# Patient Record
Sex: Female | Born: 1940 | Race: White | Hispanic: No | Marital: Married | Smoking: Never smoker
Health system: Southern US, Community
[De-identification: ages and names within clinical notes are randomized; demographics above are authoritative.]

## PROBLEM LIST (undated history)

## (undated) DIAGNOSIS — E7800 Pure hypercholesterolemia, unspecified: Secondary | ICD-10-CM

## (undated) DIAGNOSIS — K589 Irritable bowel syndrome without diarrhea: Secondary | ICD-10-CM

## (undated) DIAGNOSIS — I10 Essential (primary) hypertension: Secondary | ICD-10-CM

## (undated) DIAGNOSIS — R1900 Intra-abdominal and pelvic swelling, mass and lump, unspecified site: Secondary | ICD-10-CM

## (undated) DIAGNOSIS — G4700 Insomnia, unspecified: Secondary | ICD-10-CM

## (undated) DIAGNOSIS — E039 Hypothyroidism, unspecified: Secondary | ICD-10-CM

## (undated) DIAGNOSIS — F419 Anxiety disorder, unspecified: Secondary | ICD-10-CM

## (undated) HISTORY — DX: Anxiety disorder, unspecified: F41.9

## (undated) HISTORY — PX: ABDOMINAL HYSTERECTOMY: SHX81

## (undated) HISTORY — DX: Hypothyroidism, unspecified: E03.9

## (undated) HISTORY — DX: Irritable bowel syndrome, unspecified: K58.9

## (undated) HISTORY — DX: Intra-abdominal and pelvic swelling, mass and lump, unspecified site: R19.00

## (undated) HISTORY — PX: BACK SURGERY: SHX140

## (undated) HISTORY — DX: Insomnia, unspecified: G47.00

## (undated) HISTORY — PX: CATARACT EXTRACTION: SUR2

---

## 2011-08-25 DIAGNOSIS — E7800 Pure hypercholesterolemia, unspecified: Secondary | ICD-10-CM | POA: Insufficient documentation

## 2013-09-02 DIAGNOSIS — M461 Sacroiliitis, not elsewhere classified: Secondary | ICD-10-CM | POA: Insufficient documentation

## 2015-01-25 DIAGNOSIS — I10 Essential (primary) hypertension: Secondary | ICD-10-CM | POA: Insufficient documentation

## 2015-11-26 DIAGNOSIS — M4716 Other spondylosis with myelopathy, lumbar region: Secondary | ICD-10-CM | POA: Insufficient documentation

## 2016-09-02 DIAGNOSIS — E039 Hypothyroidism, unspecified: Secondary | ICD-10-CM | POA: Insufficient documentation

## 2017-02-24 ENCOUNTER — Other Ambulatory Visit: Payer: Self-pay | Admitting: Nurse Practitioner

## 2017-02-24 DIAGNOSIS — M4716 Other spondylosis with myelopathy, lumbar region: Secondary | ICD-10-CM

## 2017-03-09 ENCOUNTER — Other Ambulatory Visit: Payer: Self-pay

## 2017-03-11 ENCOUNTER — Ambulatory Visit
Admission: RE | Admit: 2017-03-11 | Discharge: 2017-03-11 | Disposition: A | Discharge status: Home / Self Care | Payer: Medicare Other | Source: Ambulatory Visit | Attending: Nurse Practitioner | Admitting: Nurse Practitioner

## 2017-03-11 DIAGNOSIS — M4716 Other spondylosis with myelopathy, lumbar region: Secondary | ICD-10-CM

## 2017-03-11 MED ORDER — GADOBENATE DIMEGLUMINE 529 MG/ML IV SOLN
13.0000 mL | Freq: Once | INTRAVENOUS | Status: AC | PRN
  Administered 2017-03-11: 13 mL via INTRAVENOUS

## 2017-03-25 ENCOUNTER — Other Ambulatory Visit: Payer: Self-pay | Admitting: Neurosurgery

## 2017-03-25 DIAGNOSIS — M5126 Other intervertebral disc displacement, lumbar region: Secondary | ICD-10-CM

## 2017-03-26 ENCOUNTER — Other Ambulatory Visit: Payer: Self-pay | Admitting: Neurosurgery

## 2017-03-26 ENCOUNTER — Ambulatory Visit
Admission: RE | Admit: 2017-03-26 | Discharge: 2017-03-26 | Disposition: A | Discharge status: Home / Self Care | Payer: Medicare Other | Source: Ambulatory Visit | Attending: Neurosurgery | Admitting: Neurosurgery

## 2017-03-26 DIAGNOSIS — M1990 Unspecified osteoarthritis, unspecified site: Secondary | ICD-10-CM | POA: Insufficient documentation

## 2017-03-26 DIAGNOSIS — M5126 Other intervertebral disc displacement, lumbar region: Secondary | ICD-10-CM

## 2017-03-26 DIAGNOSIS — K589 Irritable bowel syndrome without diarrhea: Secondary | ICD-10-CM | POA: Insufficient documentation

## 2017-03-26 DIAGNOSIS — F419 Anxiety disorder, unspecified: Secondary | ICD-10-CM | POA: Insufficient documentation

## 2017-03-26 MED ORDER — IOPAMIDOL (ISOVUE-M 200) INJECTION 41%
1.0000 mL | Freq: Once | INTRAMUSCULAR | Status: AC
  Administered 2017-03-26: 1 mL via EPIDURAL

## 2017-03-26 MED ORDER — METHYLPREDNISOLONE ACETATE 40 MG/ML INJ SUSP (RADIOLOG
120.0000 mg | Freq: Once | INTRAMUSCULAR | Status: AC
  Administered 2017-03-26: 120 mg via EPIDURAL

## 2017-03-26 NOTE — Discharge Instructions (Signed)

## 2017-05-22 ENCOUNTER — Other Ambulatory Visit: Payer: Self-pay | Admitting: Nurse Practitioner

## 2017-05-22 DIAGNOSIS — M4716 Other spondylosis with myelopathy, lumbar region: Secondary | ICD-10-CM

## 2017-06-03 ENCOUNTER — Ambulatory Visit
Admission: RE | Admit: 2017-06-03 | Discharge: 2017-06-03 | Disposition: A | Discharge status: Home / Self Care | Payer: Medicare Other | Source: Ambulatory Visit | Attending: Nurse Practitioner | Admitting: Nurse Practitioner

## 2017-06-03 DIAGNOSIS — M4716 Other spondylosis with myelopathy, lumbar region: Secondary | ICD-10-CM

## 2017-06-03 MED ORDER — IOPAMIDOL (ISOVUE-M 200) INJECTION 41%
1.0000 mL | Freq: Once | INTRAMUSCULAR | Status: AC
  Administered 2017-06-03: 1 mL via EPIDURAL

## 2017-06-03 MED ORDER — METHYLPREDNISOLONE ACETATE 40 MG/ML INJ SUSP (RADIOLOG
120.0000 mg | Freq: Once | INTRAMUSCULAR | Status: AC
  Administered 2017-06-03: 120 mg via EPIDURAL

## 2017-06-03 NOTE — Discharge Instructions (Signed)

## 2018-04-12 ENCOUNTER — Emergency Department (HOSPITAL_COMMUNITY): Payer: Medicare Other

## 2018-04-12 ENCOUNTER — Encounter (HOSPITAL_COMMUNITY): Payer: Self-pay | Admitting: Emergency Medicine

## 2018-04-12 ENCOUNTER — Other Ambulatory Visit: Payer: Self-pay

## 2018-04-12 ENCOUNTER — Emergency Department (HOSPITAL_COMMUNITY)
Admission: EM | Admit: 2018-04-12 | Discharge: 2018-04-12 | Disposition: A | Discharge status: Home / Self Care | Payer: Medicare Other | Attending: Emergency Medicine | Admitting: Emergency Medicine

## 2018-04-12 DIAGNOSIS — Z79899 Other long term (current) drug therapy: Secondary | ICD-10-CM | POA: Insufficient documentation

## 2018-04-12 DIAGNOSIS — R51 Headache: Secondary | ICD-10-CM | POA: Diagnosis present

## 2018-04-12 DIAGNOSIS — G4489 Other headache syndrome: Secondary | ICD-10-CM | POA: Diagnosis not present

## 2018-04-12 DIAGNOSIS — J0110 Acute frontal sinusitis, unspecified: Secondary | ICD-10-CM | POA: Insufficient documentation

## 2018-04-12 DIAGNOSIS — I10 Essential (primary) hypertension: Secondary | ICD-10-CM | POA: Insufficient documentation

## 2018-04-12 DIAGNOSIS — Z7982 Long term (current) use of aspirin: Secondary | ICD-10-CM | POA: Diagnosis not present

## 2018-04-12 HISTORY — DX: Essential (primary) hypertension: I10

## 2018-04-12 HISTORY — DX: Pure hypercholesterolemia, unspecified: E78.00

## 2018-04-12 LAB — CBC WITH DIFFERENTIAL/PLATELET
BASOS ABS: 0 10*3/uL (ref 0.0–0.1)
BASOS PCT: 0 %
EOS PCT: 1 %
Eosinophils Absolute: 0.2 10*3/uL (ref 0.0–0.7)
HEMATOCRIT: 40.6 % (ref 36.0–46.0)
Hemoglobin: 13.3 g/dL (ref 12.0–15.0)
LYMPHS PCT: 15 %
Lymphs Abs: 2.9 10*3/uL (ref 0.7–4.0)
MCH: 31.8 pg (ref 26.0–34.0)
MCHC: 32.8 g/dL (ref 30.0–36.0)
MCV: 97.1 fL (ref 78.0–100.0)
Monocytes Absolute: 1.5 10*3/uL — ABNORMAL HIGH (ref 0.1–1.0)
Monocytes Relative: 8 %
NEUTROS ABS: 15.2 10*3/uL — AB (ref 1.7–7.7)
Neutrophils Relative %: 76 %
PLATELETS: 325 10*3/uL (ref 150–400)
RBC: 4.18 MIL/uL (ref 3.87–5.11)
RDW: 14.5 % (ref 11.5–15.5)
WBC: 19.9 10*3/uL — AB (ref 4.0–10.5)

## 2018-04-12 LAB — COMPREHENSIVE METABOLIC PANEL
ALK PHOS: 98 U/L (ref 38–126)
ALT: 21 U/L (ref 0–44)
ANION GAP: 12 (ref 5–15)
AST: 16 U/L (ref 15–41)
Albumin: 4 g/dL (ref 3.5–5.0)
BILIRUBIN TOTAL: 0.5 mg/dL (ref 0.3–1.2)
BUN: 21 mg/dL (ref 8–23)
CALCIUM: 9.4 mg/dL (ref 8.9–10.3)
CO2: 24 mmol/L (ref 22–32)
Chloride: 105 mmol/L (ref 98–111)
Creatinine, Ser: 1.15 mg/dL — ABNORMAL HIGH (ref 0.44–1.00)
GFR calc Af Amer: 52 mL/min — ABNORMAL LOW (ref >60)
GFR, EST NON AFRICAN AMERICAN: 45 mL/min — AB (ref >60)
Glucose, Bld: 116 mg/dL — ABNORMAL HIGH (ref 70–99)
POTASSIUM: 3.6 mmol/L (ref 3.5–5.1)
Sodium: 141 mmol/L (ref 135–145)
TOTAL PROTEIN: 7.1 g/dL (ref 6.5–8.1)

## 2018-04-12 LAB — SEDIMENTATION RATE: Sed Rate: 23 mm/hr — ABNORMAL HIGH (ref 0–22)

## 2018-04-12 MED ORDER — SODIUM CHLORIDE 0.9 % IV BOLUS
1000.0000 mL | Freq: Once | INTRAVENOUS | Status: AC
  Administered 2018-04-12: 1000 mL via INTRAVENOUS

## 2018-04-12 MED ORDER — CLINDAMYCIN HCL 150 MG PO CAPS: 300.0000 mg | ORAL_CAPSULE | Freq: Three times a day (TID) | ORAL | 0 refills | Status: AC

## 2018-04-12 MED ORDER — FENTANYL CITRATE (PF) 100 MCG/2ML IJ SOLN
25.0000 ug | Freq: Once | INTRAMUSCULAR | Status: AC
  Administered 2018-04-12: 25 ug via INTRAVENOUS
  Filled 2018-04-12: qty 2

## 2018-04-12 MED ORDER — FLUTICASONE PROPIONATE 50 MCG/ACT NA SUSP: NASAL | 0 refills | Status: DC

## 2018-04-12 MED ORDER — IOHEXOL 300 MG/ML  SOLN
60.0000 mL | Freq: Once | INTRAMUSCULAR | Status: AC | PRN
  Administered 2018-04-12: 60 mL via INTRAVENOUS

## 2018-04-12 MED ORDER — CLINDAMYCIN HCL 150 MG PO CAPS
300.0000 mg | ORAL_CAPSULE | Freq: Once | ORAL | Status: AC
  Administered 2018-04-12: 300 mg via ORAL
  Filled 2018-04-12: qty 2

## 2018-04-12 MED ORDER — DEXAMETHASONE SODIUM PHOSPHATE 10 MG/ML IJ SOLN
10.0000 mg | Freq: Once | INTRAMUSCULAR | Status: AC
  Administered 2018-04-12: 10 mg via INTRAVENOUS
  Filled 2018-04-12: qty 1

## 2018-04-12 MED ORDER — PROCHLORPERAZINE EDISYLATE 10 MG/2ML IJ SOLN
5.0000 mg | Freq: Once | INTRAMUSCULAR | Status: AC
  Administered 2018-04-12: 5 mg via INTRAVENOUS
  Filled 2018-04-12: qty 2

## 2018-04-12 NOTE — ED Provider Notes (Signed)
[facility] EMERGENCY DEPARTMENT Provider Note   CSN: [id number] Arrival date & time: 04/12/18  1401     History   Chief Complaint Chief Complaint  Patient presents with  . Headache    HPI Daira Hine is a 77 y.o. female.  HPI Patient presents with her husband who assists with the HPI. She presents due to headache, which is atypical for her. She does have a history of headaches associate with sinus infection, but has no ongoing sinus infection-like symptoms. Yesterday she developed this headache, which spontaneously improved for several hours, but recurred today about 6 hours ago, and since that time has been persistent, worsening, is severe across the frontal head. No vision changes, there is some nausea, no vomiting, no confusion. No relief with OTC medication.  Past Medical History:  Diagnosis Date  . High cholesterol   . Hypertension     Patient Active Problem List   Diagnosis Date Noted  . Anxiety 03/26/2017  . IBS (irritable bowel syndrome) 03/26/2017  . Osteoarthritis 03/26/2017  . Acquired hypothyroidism 09/02/2016  . 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 Essential hypertension 01/25/2015  . Sacroiliitis, not elsewhere classified (HCC) 09/02/2013  . Pure hypercholesterolemia 08/25/2011    Past Surgical History:  Procedure Laterality Date  . ABDOMINAL HYSTERECTOMY    . BACK SURGERY    . CATARACT EXTRACTION       OB History    Gravida      Para      Term      Preterm      AB      Living  1     SAB      TAB      Ectopic      Multiple      Live Births               Home Medications    Prior to Admission medications   Medication Sig Start Date End Date Taking? Authorizing Provider  ALPRAZolam Prudy Feeler) 0.5 MG tablet Take 0.5 mg by mouth 2 (two) times daily as needed for anxiety or sleep.  11/10/16  Yes [provider]  amLODipine (NORVASC) 10 MG tablet TAKE 1 TABLET BY MOUTH  DAILY 02/13/17  Yes  [provider]  aspirin EC 325 MG tablet Take 325 mg by mouth daily.    Yes [provider]  diclofenac (VOLTAREN) 75 MG EC tablet TAKE ONE TABLET BY MOUTH 2  TIMES DAILY. 11/10/16  Yes [provider]  dicyclomine (BENTYL) 20 MG tablet Take 20 mg by mouth daily as needed for spasms.  04/02/16  Yes [provider]  fluticasone (FLONASE) 50 MCG/ACT nasal spray USE TWO SPRAY(S) IN EACH NOSTRIL ONCE DAILY 04/14/16  Yes [provider]  irbesartan (AVAPRO) 300 MG tablet Take 300 mg by mouth daily.  03/09/18  Yes [provider]  levothyroxine (SYNTHROID, LEVOTHROID) 25 MCG tablet Take 25 mcg by mouth daily before breakfast.  07/24/16  Yes [provider]  oxyCODONE-acetaminophen (PERCOCET/ROXICET) 5-325 MG tablet Take 1 tablet by mouth every 6 (six) hours as needed for moderate pain or severe pain.  03/14/16  Yes [provider]  simvastatin (ZOCOR) 20 MG tablet TAKE 1 TABLET BY MOUTH AT  BEDTIME 12/12/16  Yes [provider]  traMADol (ULTRAM) 50 MG tablet Take 50 mg by mouth every 6 (six) hours as needed for moderate pain.  02/16/17  Yes [provider]    Family History History  reviewed. No pertinent family history.  Social History Social History   Tobacco Use  . Smoking status: Never Smoker  . Smokeless tobacco: Never Used  Substance Use Topics  . Alcohol use: Yes    Frequency: Never    Comment: occ  . Drug use: Never     Allergies   Acetaminophen; Penicillins; Sulfa antibiotics; Gabapentin; Niacin and related; and Pitavastatin   Review of Systems Review of Systems  Constitutional:       Per HPI, otherwise negative  HENT:       Per HPI, otherwise negative  Respiratory:       Per HPI, otherwise negative  Cardiovascular:       Per HPI, otherwise negative  Gastrointestinal: Positive for nausea. Negative for vomiting.  Endocrine:       Negative aside from HPI  Genitourinary:       Neg aside from  HPI   Musculoskeletal:       Per HPI, otherwise negative  Skin: Negative.   Neurological: Positive for light-headedness and headaches. Negative for syncope.     Physical Exam Updated Vital Signs BP (!) 161/77 (BP Location: Left Arm)   Pulse 95   Temp 97.9 F (36.6 C) (Oral)   Resp 15   Ht 5' (1.524 m)   Wt 65.3 kg (144 lb)   SpO2 98%   BMI 28.12 kg/m   Physical Exam  Constitutional: She is oriented to person, place, and time. She appears well-developed and well-nourished. No distress.  Uncomfortable appearing elderly female  HENT:  Head: Normocephalic and atraumatic.  Tenderness to palpation throughout the forehead and bilateral temporal areas  Eyes: Conjunctivae and EOM are normal.  Cardiovascular: Normal rate and regular rhythm.  Pulmonary/Chest: Effort normal and breath sounds normal. No stridor. No respiratory distress.  Abdominal: She exhibits no distension.  Musculoskeletal: She exhibits no edema.  Neurological: She is alert and oriented to person, place, and time. She displays atrophy. She displays no tremor. No cranial nerve deficit. She exhibits normal muscle tone. She displays no seizure activity. Coordination normal.  Skin: Skin is warm and dry.  Psychiatric: She has a normal mood and affect.  Nursing note and vitals reviewed.    ED Treatments / Results  Labs (all labs ordered are listed, but only abnormal results are displayed) Labs Reviewed  COMPREHENSIVE METABOLIC PANEL - Abnormal; Notable for the following components:      Result Value   Glucose, Bld 116 (*)    Creatinine, Ser 1.15 (*)    GFR calc non Af Amer 45 (*)    GFR calc Af Amer 52 (*)    All other components within normal limits  CBC WITH DIFFERENTIAL/PLATELET - Abnormal; Notable for the following components:   WBC 19.9 (*)    Neutro Abs 15.2 (*)    Monocytes Absolute 1.5 (*)    All other components within normal limits  SEDIMENTATION RATE - Abnormal; Notable for the following components:     Sed Rate 23 (*)    All other components within normal limits    EKG None  Radiology Ct Venogram Head  Result Date: 04/12/2018 CLINICAL DATA:  Headaches.  Dural venous sinus thrombosis suspected. EXAM: CT VENOGRAM HEAD TECHNIQUE: After administration of intravenous contrast in the venous phase axial CT was acquired of the head and surrounding structures with multiplanar reconstruction. CONTRAST:  60mL OMNIPAQUE IOHEXOL 300 MG/ML  SOLN COMPARISON:  None. FINDINGS: Brain: No stroke, hemorrhage, focal mass effect, extra-axial collection, or herniation. No  mild chronic microvascular ischemic changes and parenchymal volume loss of the brain. Vascular: Patent superior sagittal sinus, straight sinus, bilateral transverse sinus, bilateral sigmoid sinus, bilateral upper internal jugular veins. The left transverse sinus system is smaller than the right transverse sinus system, normal variant. Skull: Negative. Paranasal sinuses: Partial opacification of right mastoid tip. Normal aeration of left mastoid air cells. Opacification of the right frontal sinus, right anterior ethmoid air cells, and the partially visualized right maxillary sinus. Orbits: Bilateral intra-ocular lens replacement. Soft tissues: Right frontal region scalp nodule with calcifications measuring up to 12 mm. IMPRESSION: 1. No dural venous sinus thrombosis identified. 2. No acute intracranial abnormality. 3. Mild chronic microvascular ischemic changes and parenchymal volume loss of the brain. 4. Right frontal, anterior ethmoid, and maxillary sinus opacification. This is a right middle meatus obstructive pattern, direct visualization recommended. 5. Right superior frontal region scalp nodule measuring up to 12 mm, direct visualization recommended. Electronically Signed   By: Mitzi Hansen M.D.   On: 04/12/2018 17:13    Procedures Procedures (including critical care time)  Medications Ordered in ED Medications  clindamycin  (CLEOCIN) capsule 300 mg (has no administration in time range)  sodium chloride 0.9 % bolus 1,000 mL (0 mLs Intravenous Stopped 04/12/18 1722)  prochlorperazine (COMPAZINE) injection 5 mg (5 mg Intravenous Given 04/12/18 1501)  fentaNYL (SUBLIMAZE) injection 25 mcg (25 mcg Intravenous Given 04/12/18 1601)  dexamethasone (DECADRON) injection 10 mg (10 mg Intravenous Given 04/12/18 1600)  iohexol (OMNIPAQUE) 300 MG/ML solution 60 mL (60 mLs Intravenous Contrast Given 04/12/18 1648)     Initial Impression / Assessment and Plan / ED Course  I have reviewed the triage vital signs and the nursing notes.  Pertinent labs & imaging results that were available during my care of the patient were reviewed by me and considered in my medical decision making (see chart for details).     5:45 PM On repeat exam patient is feeling better. She has received Compazine, fluids, fentanyl, Decadron. After initial presentation concerning for substantial frontal head pain atypical headache, with differential including dural vein thrombosis, sinusitis, mass, the patient has had evaluation including labs notable for leukocytosis, and CT venogram consistent with acute sinusitis, with occlusion of medial meatus. Patient improved substantially here, and given this, absence of sustained fever, confusion, disorientation, and with reassuring CT scan, the patient will start antibiotics, inhaled steroids for relief. We also discussed option of Sudafed for additional therapy, and the patient notes that she has used this medicine previously, though some time ago for relief.  We discussed risks and benefits of this, and patient is amenable to trying it, also understanding of need to stop if she develops any untoward effects. After initial antibiotics provided here, and with the after mentioned improvement, she was discharged in stable condition.  Final Clinical Impressions(s) / ED Diagnoses  Cephalgia Acute sinusitis   Gerhard Munch,  MD 04/12/18 1747

## 2018-04-12 NOTE — ED Triage Notes (Signed)
PT was sent to ED for eval from urgent care today. PT states recent increased in her blood pressure medications x2 weeks and some relief of her headaches but the pain started back again yesterday.

## 2018-04-12 NOTE — ED Notes (Signed)
Pt with complaints of frontal HA since 10 am Pt very tearful. No deficits noted

## 2018-04-12 NOTE — Discharge Instructions (Signed)
In addition to the prescribed antibiotics, and inhaled steroid, please use Sudafed for additional relief, but as discussed, please stop this medication if you start to develop any untoward effects.  Return here for concerning changes in your condition.

## 2019-07-18 ENCOUNTER — Encounter: Payer: Self-pay | Admitting: Internal Medicine

## 2019-07-29 ENCOUNTER — Ambulatory Visit: Payer: Medicare Other | Admitting: Gastroenterology

## 2019-08-04 ENCOUNTER — Encounter: Payer: Self-pay | Admitting: Gastroenterology

## 2019-08-04 NOTE — Progress Notes (Signed)
Referring Provider: Hermine Messick, MD Primary Care Physician:  Hermine Messick, MD Primary Gastroenterologist:  Dr. Gala Romney  Chief Complaint  Patient presents with   Diarrhea    only happens when she goes several days w/o BM and then she will have several loose stools    HPI:   Jenny Garcia is a 78 y.o. female presenting today at the request of Hermine Messick, MD for diarrhea.  Patient called her PCP on 07/06/2019 with complaint of diarrhea and stomach cramps for 1 week.  Stated she took her medication for IBS which did not help her symptoms.  She was offered an office visit with her PCP but she declined and requested a referral to GI.  CBC on 05/31/2019 with hemoglobin 12.6. Elevated CA-125 at 57.7, overall stable since 2019. BMP on 07/29/2019 with creatinine 0.93 with GFR 59, otherwise normal.  TSH elevated at 5.37.   CT abdomen and pelvis with reduced contrast on 07/21/2019 for evaluation of pelvic mass and elevated CA-125 with cyst in the posterior right pelvis, possibly ovarian however the uterus is absent and ovaries are not identified.  Of note patient is difficult historian.  Patient reports having diarrhea for the last 2-3 months.  She will go 1 week without a bowel movement, then have 1 day where she will have a very large watery bowel movement or a few loose to watery stools.  She then returns to no bowel movement for another week.  Reports waking up in the mornings with her stomach hurting.  This is on the days that she has diarrhea.  Has been taking Bentyl for her stomach cramping.  Sometimes twice in 1 day.  Often her diarrhea is in the evenings to early morning hours.  Reporting timeframe between 8 PM and 1 AM.  May be 4-5 BMs during this time.  Also reports stomach feeling up with gas.  She and her husband eat a light snack before bed.  Reports being diagnosed with IBS in the 1970s and has been using Bentyl as needed for stomach cramping since then.  Prior to 2-3 months ago, she  rarely had to use Bentyl.  Still she would only have 1 bowel movement a week, but this was solid.  Reports the stools were actually very hard.   This morning, she had 1 large watery BM. Last BM prior to this was Sunday night (5 days ago). No blood in the stool. No black stools. No antibiotics or hospitalizations. Doesn't drink well water. Has been eating out a lot. No seafood. She is lactose intolerant. She tries to avoid this. Will cause diarrhea otherwise.  As I questioned her further about dairy consumption, she reports eating a coconut pie last night, admits to eating cheese prior to her last episode of diarrhea, and eating ice cream.  States she had a lot of cramping, gas, and diarrhea with the ice cream.  No abdominal pain without diarrhea. No nausea or vomiting. No acid reflux or heartburn. No dysphagia.  No unintentional weight loss.  No fever or chills. No lightheadedness, dizziness, or feeling like she will pass out. No chest pain, heart palpitations, shortness of breath, or cough.   Diastolic BP is low today. Discussed with patient. She is without any significant symptoms at this time. States her BP is normally high. I have advised she call her PCP to discuss this.   Past Medical History:  Diagnosis Date   Acquired hypothyroidism    Anxiety    High cholesterol  Hypertension    IBS (irritable bowel syndrome)    Insomnia     Past Surgical History:  Procedure Laterality Date   ABDOMINAL HYSTERECTOMY     BACK SURGERY     CATARACT EXTRACTION      Current Outpatient Medications  Medication Sig Dispense Refill   ALPRAZolam (XANAX) 0.5 MG tablet Take 0.5 mg by mouth 2 (two) times daily as needed for anxiety or sleep.      aspirin EC 325 MG tablet Take 325 mg by mouth daily.      budesonide (PULMICORT) 0.5 MG/2ML nebulizer solution Take 0.5 mg by nebulization 2 (two) times daily.     candesartan (ATACAND) 8 MG tablet Take 8 mg by mouth daily.     clotrimazole  (LOTRIMIN) 1 % cream Apply 1 application topically as needed.     diclofenac (VOLTAREN) 75 MG EC tablet TAKE ONE TABLET BY MOUTH 2  TIMES DAILY.     dicyclomine (BENTYL) 20 MG tablet Take 20 mg by mouth daily as needed for spasms.      diltiazem (TIAZAC) 360 MG 24 hr capsule Take 360 mg by mouth daily.     econazole nitrate 1 % cream Apply topically 2 (two) times daily.     fluticasone (FLONASE) 50 MCG/ACT nasal spray USE TWO SPRAY(S) IN EACH NOSTRIL ONCE DAILY 16 g 0   naloxone (NARCAN) nasal spray 4 mg/0.1 mL Place 1 spray into the nose.     spironolactone (ALDACTONE) 25 MG tablet Take 25 mg by mouth daily.     tiZANidine (ZANAFLEX) 4 MG tablet Take 4 mg by mouth every 6 (six) hours as needed for muscle spasms.     amLODipine (NORVASC) 10 MG tablet TAKE 1 TABLET BY MOUTH  DAILY     irbesartan (AVAPRO) 300 MG tablet Take 300 mg by mouth daily.      levothyroxine (SYNTHROID, LEVOTHROID) 25 MCG tablet Take 25 mcg by mouth daily before breakfast.      oxyCODONE-acetaminophen (PERCOCET/ROXICET) 5-325 MG tablet Take 1 tablet by mouth every 6 (six) hours as needed for moderate pain or severe pain.      simvastatin (ZOCOR) 20 MG tablet TAKE 1 TABLET BY MOUTH AT  BEDTIME     traMADol (ULTRAM) 50 MG tablet Take 50 mg by mouth every 6 (six) hours as needed for moderate pain.      No current facility-administered medications for this visit.     Allergies as of 08/05/2019 - Review Complete 04/12/2018  Allergen Reaction Noted   Acetaminophen Other (See Comments) 05/23/2011   Penicillins Hives and Itching 12/11/2010   Sulfa antibiotics  05/23/2011   Gabapentin Other (See Comments) 05/27/2017   Niacin and related Other (See Comments) 02/25/2012   Pitavastatin Diarrhea and Rash 05/23/2011    No family history on file.  Social History   Socioeconomic History   Marital status: Married    Spouse name: Not on file   Number of children: Not on file   Years of education: Not on  file   Highest education level: Not on file  Occupational History   Not on file  Social Needs   Financial resource strain: Not on file   Food insecurity    Worry: Not on file    Inability: Not on file   Transportation needs    Medical: Not on file    Non-medical: Not on file  Tobacco Use   Smoking status: Never Smoker   Smokeless tobacco: Never Used  Substance  and Sexual Activity   Alcohol use: Yes    Frequency: Never    Comment: occ   Drug use: Never   Sexual activity: Not on file  Lifestyle   Physical activity    Days per week: Not on file    Minutes per session: Not on file   Stress: Not on file  Relationships   Social connections    Talks on phone: Not on file    Gets together: Not on file    Attends religious service: Not on file    Active member of club or organization: Not on file    Attends meetings of clubs or organizations: Not on file    Relationship status: Not on file   Intimate partner violence    Fear of current or ex partner: Not on file    Emotionally abused: Not on file    Physically abused: Not on file    Forced sexual activity: Not on file  Other Topics Concern   Not on file  Social History Narrative   Not on file    Review of Systems: Gen: See HPI CV: See HPI Resp: See HPI GI: See HPI GU : Denies urinary burning, urinary frequency, urinary hesitancy MS: Admits to arthritis in several joints.  Derm: Denies rash Psych: States she is lonesome since [place] started. It is just her and her husband at home. No visitors.   Heme: Denies bruising, bleeding  Physical Exam: BP (!) 129/47    Pulse (!) 57    Temp 98.2 F (36.8 C) (Oral)    Ht 5' (1.524 m)    Wt 143 lb 9.6 oz (65.1 kg)    BMI 28.04 kg/m  General:   Alert and oriented. Pleasant and cooperative. Well-nourished and well-developed. Walks slowly holding her lower back when going to the exam table. Uses a cain.  Head:  Normocephalic and atraumatic. Eyes:  Without icterus,  sclera clear and conjunctiva pink.  Ears:  Normal auditory acuity. Nose:  No deformity, discharge,  or lesions.  Lungs:  Clear to auscultation bilaterally. No wheezes, rales, or rhonchi. No distress.  Heart:  S1, S2 present without murmurs appreciated.  Abdomen:  +BS, soft, and non-distended. Mild upper abdominal tenderness to palpation.  No abdominal pain without palpation. No HSM noted. No guarding or rebound. No masses appreciated.   Rectal:  Deferred  Msk:  Symmetrical without gross deformities.  Extremities:  Without edema. Neurologic:  Alert and  oriented x4;  grossly normal neurologically. Skin:  Intact without significant lesions or rashes. Psych: Normal mood and affect.  Labs: 05/31/2019: CMP: Glucose 92, creatinine 1.22, sodium 136, potassium 4.9, chloride 101, calcium 9.7, total protein 6.7, albumin 4.5, total bilirubin 0.3, alk phos 86, AST 13, ALT 20 TSH 6.610 (H) CBC: WBC 11.9 (H), hemoglobin 12.6, MCV 96, MCV H 32.1, MCHC 33.2, platelets 386 CA-125 57.7 (H) RPR nonreactive  07/29/2019 BMP: Glucose 89, creatinine 0.93, sodium 141, potassium 5.0, chloride 104, calcium 9.8 TSH 5.37 (H), free T4 0.9

## 2019-08-05 ENCOUNTER — Encounter: Payer: Self-pay | Admitting: Gastroenterology

## 2019-08-05 ENCOUNTER — Ambulatory Visit: Payer: Medicare Other | Admitting: Gastroenterology

## 2019-08-05 ENCOUNTER — Telehealth: Payer: Self-pay | Admitting: Gastroenterology

## 2019-08-05 ENCOUNTER — Other Ambulatory Visit: Payer: Self-pay

## 2019-08-05 VITALS — BP 129/47 | HR 57 | Temp 98.2°F | Ht 60.0 in | Wt 143.6 lb

## 2019-08-05 DIAGNOSIS — R197 Diarrhea, unspecified: Secondary | ICD-10-CM

## 2019-08-05 NOTE — Telephone Encounter (Signed)
I did not attach the lactose-free diet to patient's AVS today.  Can we please mail this to patient?

## 2019-08-05 NOTE — Patient Instructions (Signed)
Please have x-ray of your abdomen, blood work, and stool studies completed.  I doubt this is an infectious process, but we will go ahead and rule this out with the stool studies.  Further recommendations to follow.  Please follow a lactose-free diet.  If you are going to eat something with dairy in it, take Lactaid pills prior.  See handout below.  We will plan to see you back in 3 months.  Aliene Altes, PA-C [facility]

## 2019-08-05 NOTE — Assessment & Plan Note (Addendum)
78 year old female with past medical history of hypothyroidism, HLD, HTN, anxiety, insomnia, and IBS who presents with chief complaint of diarrhea.  Symptoms seem more consistent with chronic constipation and overflow diarrhea.  She reports history of IBS since the 1970s with taking Bentyl for abdominal cramping as needed.  Historically would have 1 hard bowel movement a week.  Now having 1 day a week where she may have a large loose to watery bowel movement or 4-5 loose to watery bowel movements.  No other bowel movements between these episodes.  Associated abdominal cramping and abdominal gas the day she has diarrhea.  Denies BRBPR, melena, or unintentional weight loss.  No recent antibiotics or hospitalizations.  Does not drink well water.  She reports history of lactose intolerance and upon further questioning, she admits to eating coconut cake, cheese, and ice cream and notes she did have diarrhea on these days.  Recent BMP on 07/29/2019 with electrolytes within normal limits.  Creatinine 0.93 with GFR slightly low at 59.  Last CBC in August with normal hemoglobin at 12.6, slightly elevated WBC at 11.9.  Suspect patient has underlying IBS constipation type with overflow diarrhea. Hypothyroidism likely contributing to underlying constipation and lactose intolerance likely contributing to current diarrhea symptoms.  I do not suspect this is an infectious diarrhea; however, as patient was a difficult historian, I am not confident in her reported frequency of diarrhea and will go ahead and check stool studies to be sure.  She was advised to only submit the stool studies if her stool was watery. Update CBC She was advised to follow a lactose-free diet or take Lactaid pills before eating any dairy.  Lactose-free handout provided. DG abdomen to evaluate stool burden as I suspect chronic constipation. Follow-up in 3 months.

## 2019-08-08 NOTE — Telephone Encounter (Signed)
Noted, lactose free diet info mailed to pt.

## 2019-08-09 ENCOUNTER — Ambulatory Visit (HOSPITAL_COMMUNITY)
Admission: RE | Admit: 2019-08-09 | Discharge: 2019-08-09 | Disposition: A | Discharge status: Home / Self Care | Payer: Medicare Other | Source: Ambulatory Visit | Attending: Gastroenterology | Admitting: Gastroenterology

## 2019-08-09 ENCOUNTER — Other Ambulatory Visit: Payer: Self-pay

## 2019-08-09 ENCOUNTER — Other Ambulatory Visit (HOSPITAL_COMMUNITY)
Admission: RE | Admit: 2019-08-09 | Discharge: 2019-08-09 | Disposition: A | Discharge status: Home / Self Care | Payer: Medicare Other | Source: Ambulatory Visit | Attending: Gastroenterology | Admitting: Gastroenterology

## 2019-08-09 DIAGNOSIS — R197 Diarrhea, unspecified: Secondary | ICD-10-CM | POA: Insufficient documentation

## 2019-08-10 NOTE — Progress Notes (Signed)
Moderate to large amount of stool in the colon. The diarrhea patient has been experiencing is likely overflow along with being triggered by dairy as we talked about. We need to get her bowel moving regularly. I would like for her to start MiraLAX 1 capful (17g) daily. Goal is to have BMs daily. She should continue following lactose free diet as recommended at time of office visit. I would avoid using bentyl for now as this is going to worsen constipation.

## 2019-11-03 ENCOUNTER — Ambulatory Visit: Payer: Medicare Other | Admitting: Gastroenterology

## 2019-11-08 NOTE — Progress Notes (Signed)
Referring Provider: Harvie Heck, MD Primary Care Physician:  Harvie Heck, MD Primary GI Physician: Dr. Jena Gauss  Chief Complaint  Patient presents with  . Constipation    hx of diarrhea,IBS    HPI:   Jenny Garcia is a 79 y.o. female presenting today for follow-up of diarrhea.  She was last seen at the time of the initial consult on 08/05/2019 for the same.  Patient reported alternating constipation and diarrhea for 2-3 months.  Would go 1 week with no BM and then have 1 day with large watery BM or a few loose to watery stools.  Associated abdominal pain on the day she had diarrhea. Was taking Bentyl for stomach cramps daily to twice daily.  Reporting diagnosis of IBS in the 1970s using Bentyl since then for stomach cramps.  Also reported lactose intolerance but admitted to consumption of various dairy products that she then remembered were closely associated with her days of diarrhea.  Prior to 2-3 months ago, she reports only having 1 BM a week that was very hard and was rarely using Bentyl at that time.  She was without alarm symptoms.  Suspected chronic constipation with overflow diarrhea with lactose intolerance contributing to diarrhea symptoms.  Hypothyroidism may be contributing to constipation.  Due to patient being a difficult historian, doubted infectious etiology, but will go ahead and check stool studies.  She is advised to submit stool testing if stools are watery.  Would also update CBC and obtain abdominal x-ray to evaluate stool burden.  She is also advised to follow a lactose-free diet or take Lactaid pills.   Labs not completed. Abdominal x-ray on 08/09/2019 with nonobstructed gas pattern with moderate to large feces in the colon.  She was advised to start MiraLAX 1 capful daily with goal of daily BMs.  She was to continue a lactose-free diet and avoid using Bentyl.   Today:  States diarrhea has resolved and she is struggling with constipation. Tried MiraLAX 2-3 times a  week. Didn't feel this helped. Not taking at this time. Ate turnip greens Sunday and had BMs "all day." Stools were formed. No watery diarrhea. Reports having a lot of gas. States her thyroid pill causes more gas so she is not taking this regularly. Typically 1 BM a week. Stools are hard. No blood in the stool. No black stools.   Reports stomach ache. Mostly in the lower abdomen. Also in the upper abdomen. Not as bad as it was. Used to wake up with it. Usually before the day is over, it will start hurting. No postprandial pain. Improves with a good BM. Has taken bentyl a couple times and this helps with the pain. Trying not to take it much because of constipation.   Avoiding green vegetables to try to limit gas. Eating yogurt 1-2 times a week at night. No ice cream. No milk. Rare cheese.   Drinking a lot of soda. Not drinking much water. Water makes her feel nauseous.  Otherwise, no nausea.  No vomiting. No GERD or dysphagia.   Last TCS "not long ago." States she never had polyps and was told she never had to have another one.  Reinforced with this and states "I will never have another one."     Past Medical History:  Diagnosis Date  . Acquired hypothyroidism   . Anxiety   . High cholesterol   . Hypertension   . IBS (irritable bowel syndrome)   . Insomnia     Past  Surgical History:  Procedure Laterality Date  . ABDOMINAL HYSTERECTOMY    . BACK SURGERY     x3  . CATARACT EXTRACTION      Current Outpatient Medications  Medication Sig Dispense Refill  . ALPRAZolam (XANAX) 0.5 MG tablet Take 0.5 mg by mouth 2 (two) times daily as needed for anxiety or sleep.     Marland Kitchen aspirin EC 325 MG tablet Take 325 mg by mouth daily.     . budesonide (PULMICORT) 0.5 MG/2ML nebulizer solution Take 0.5 mg by nebulization 2 (two) times daily.    . candesartan (ATACAND) 8 MG tablet Take 8 mg by mouth daily.    . clotrimazole (LOTRIMIN) 1 % cream Apply 1 application topically as needed.    . diclofenac  (VOLTAREN) 75 MG EC tablet TAKE ONE TABLET BY MOUTH 2  TIMES DAILY.    Marland Kitchen dicyclomine (BENTYL) 20 MG tablet Take 20 mg by mouth daily as needed for spasms.     Marland Kitchen diltiazem (TIAZAC) 360 MG 24 hr capsule Take 360 mg by mouth daily.    Marland Kitchen econazole nitrate 1 % cream Apply topically 2 (two) times daily.    . fluticasone (FLONASE) 50 MCG/ACT nasal spray USE TWO SPRAY(S) IN EACH NOSTRIL ONCE DAILY 16 g 0  . levothyroxine (SYNTHROID, LEVOTHROID) 25 MCG tablet Take 25 mcg by mouth daily before breakfast.     . naloxone (NARCAN) nasal spray 4 mg/0.1 mL Place 1 spray into the nose.    . oxyCODONE-acetaminophen (PERCOCET/ROXICET) 5-325 MG tablet Take 1 tablet by mouth every 6 (six) hours as needed for moderate pain or severe pain.     Marland Kitchen spironolactone (ALDACTONE) 25 MG tablet Take 25 mg by mouth daily.    Marland Kitchen tiZANidine (ZANAFLEX) 4 MG tablet Take 4 mg by mouth every 6 (six) hours as needed for muscle spasms.     No current facility-administered medications for this visit.    Allergies as of 11/09/2019 - Review Complete 11/09/2019  Allergen Reaction Noted  . Acetaminophen Other (See Comments) 05/23/2011  . Penicillins Hives and Itching 12/11/2010  . Sulfa antibiotics  05/23/2011  . Gabapentin Other (See Comments) 05/27/2017  . Niacin and related Other (See Comments) 02/25/2012  . Pitavastatin Diarrhea and Rash 05/23/2011    Family History  Problem Relation Age of Onset  . Colon cancer Neg Hx     Social History   Socioeconomic History  . Marital status: Married    Spouse name: Not on file  . Number of children: Not on file  . Years of education: Not on file  . Highest education level: Not on file  Occupational History  . Not on file  Tobacco Use  . Smoking status: Never Smoker  . Smokeless tobacco: Never Used  Substance and Sexual Activity  . Alcohol use: Not Currently  . Drug use: Never  . Sexual activity: Not on file  Other Topics Concern  . Not on file  Social History Narrative  .  Not on file   Social Determinants of Health   Financial Resource Strain:   . Difficulty of Paying Living Expenses: Not on file  Food Insecurity:   . Worried About Programme researcher, broadcasting/film/video in the Last Year: Not on file  . Ran Out of Food in the Last Year: Not on file  Transportation Needs:   . Lack of Transportation (Medical): Not on file  . Lack of Transportation (Non-Medical): Not on file  Physical Activity:   . Days of  Exercise per Week: Not on file  . Minutes of Exercise per Session: Not on file  Stress:   . Feeling of Stress : Not on file  Social Connections:   . Frequency of Communication with Friends and Family: Not on file  . Frequency of Social Gatherings with Friends and Family: Not on file  . Attends Religious Services: Not on file  . Active Member of Clubs or Organizations: Not on file  . Attends Archivist Meetings: Not on file  . Marital Status: Not on file    Review of Systems: Gen: Denies fever.  States she gets cold a lot.  Denies presyncope or syncope. CV: Denies chest pain or heart palpitations Resp: Denies dyspnea or cough. GI: See HPI Derm: Denies rash Psych: Admits to anxiety.  Heme: Denies bruising or bleeding.  Physical Exam: BP (!) 160/75   Pulse 82   Temp (!) 96.8 F (36 C) (Temporal)   Ht 5' (1.524 m)   Wt 142 lb (64.4 kg)   BMI 27.73 kg/m  General:   Alert and oriented. No distress noted. Pleasant and cooperative.  Head:  Normocephalic and atraumatic. Eyes:  Conjuctiva clear without scleral icterus. Heart:  S1, S2 present without murmurs appreciated. Lungs:  Clear to auscultation bilaterally. No wheezes, rales, or rhonchi. No distress.  Abdomen:  +BS, soft, and non-distended.  Mild tenderness to palpation diffusely.  No rebound or guarding. No HSM or masses noted. Msk:  Symmetrical without gross deformities. Normal posture. Extremities:  Without edema. Neurologic:  Alert and  oriented x4 Psych: Normal mood and affect.

## 2019-11-09 ENCOUNTER — Encounter: Payer: Self-pay | Admitting: Internal Medicine

## 2019-11-09 ENCOUNTER — Ambulatory Visit: Payer: Medicare Other | Admitting: Gastroenterology

## 2019-11-09 ENCOUNTER — Encounter: Payer: Self-pay | Admitting: Gastroenterology

## 2019-11-09 ENCOUNTER — Other Ambulatory Visit: Payer: Self-pay

## 2019-11-09 VITALS — BP 160/75 | HR 82 | Temp 96.8°F | Ht 60.0 in | Wt 142.0 lb

## 2019-11-09 DIAGNOSIS — K5900 Constipation, unspecified: Secondary | ICD-10-CM

## 2019-11-09 DIAGNOSIS — R1084 Generalized abdominal pain: Secondary | ICD-10-CM

## 2019-11-09 NOTE — Assessment & Plan Note (Addendum)
79 year old female with history of hypothyroidism, HTN, HLD, anxiety, insomnia, and irritable bowel syndrome presenting constipation that is not adequately managed.  At her last visit, she reported episodes of diarrhea followed by days of no BM.  Suspected overflow diarrhea.  Abdominal x-ray completed on 08/09/2019 with nonobstructing gas pattern with moderate to large feces in the colon.  She is advised to use MiraLAX 1 capful daily.  She reports using MiraLAX 2-3 times a week without significant improvement and is not currently using anything regularly to help with bowel movements.  Currently having about 1 hard stool a week.  Occasional loose stools which I still suspect is related to overflow diarrhea.  Associated generalized abdominal pain, greatest in the lower abdomen, that improves after good BM.  Also with increased flatus. Trying not to take Bentyl due to constipation but she notes this does help her abdominal pain.  Minimal water intake.  No alarm symptoms.  No significant upper GI symptoms.  Reports colonoscopy "not long ago" in [place] without polyps. Told she never had to have another colonoscopy and reinforces that she does not want to have another one.  Constipation likely multifactorial in the setting of hypothyroidism with last TSH 5.3H in October 2020, chronic opioid use, and underlying IBS.  Of note, patient is not taking levothyroxine daily as she feels this produces more gas.  I explained that she needs to be taken this daily as this may be contributing to her constipation. Abdominal pain is likely secondary to significant constipation.  Abdominal exam with mild diffuse tenderness to palpation.  Trial of Amitiza 8 mcg twice daily with breakfast and dinner.  She was advised to call with a progress report in 1-2 weeks.  If this works well, will send in a prescription. Continue to monitor symptoms.  If abdominal pain worsens, she was advised to let us know and we would likely pursue CT. Increase  water intake to keep urine pale yellow to clear.  Discussed possibly adding flavor packets to help her drink more water. Avoid gas producing foods including broccoli, cabbage, cauliflower, and baked beans. Avoid soda. No chewing gum. No drinking through a straw.  Handout provided. Plan to follow-up in 3 months.  She is advised to call should questions or concerns prior.

## 2019-11-09 NOTE — Patient Instructions (Addendum)
Start Amitiza 8 mcg twice daily with breakfast and dinner.  Call in 1-2 weeks with a progress report.  If this works well, I will send in a prescription.  Continue to monitor your abdominal pain.  This should improve as her constipation improves.  If this worsens, please call.   Increase your water intake.  You should drink enough water to keep your urine pale yellow to clear.  Avoid gas producing foods including broccoli, cabbage, cauliflower, and baked beans. Avoid soda. No chewing gum. No drinking through a straw.   We will follow-up with you in 3 months. Call if questions or concerns prior.   Ermalinda Memos, PA-C [facility]    Abdominal Bloating When you have abdominal bloating, your abdomen may feel full, tight, or painful. It may also look bigger than normal or swollen (distended). Common causes of abdominal bloating include:  Swallowing air.  Constipation.  Problems digesting food.  Eating too much.  Irritable bowel syndrome. This is a condition that affects the large intestine.  Lactose intolerance. This is an inability to digest lactose, a natural sugar in dairy products.  Celiac disease. This is a condition that affects the ability to digest gluten, a protein found in some grains.  Gastroparesis. This is a condition that slows down the movement of food in the stomach and small intestine. It is more common in people with diabetes mellitus.  Gastroesophageal reflux disease (GERD). This is a digestive condition that makes stomach acid flow back into the esophagus.  Urinary retention. This means that the body is holding onto urine, and the bladder cannot be emptied all the way. Follow these instructions at home: Eating and drinking  Avoid eating too much.  Try not to swallow air while talking or eating.  Avoid eating while lying down.  Avoid these foods and drinks: ? Foods that cause gas, such as broccoli, cabbage, cauliflower, and baked  beans. ? Carbonated drinks. ? Hard candy. ? Chewing gum. Medicines  Take over-the-counter and prescription medicines only as told by your health care provider.  Take probiotic medicines. These medicines contain live bacteria or yeasts that can help digestion.  Take coated peppermint oil capsules. Activity  Try to exercise regularly. Exercise may help to relieve bloating that is caused by gas and relieve constipation. General instructions  Keep all follow-up visits as told by your health care provider. This is important. Contact a health care provider if:  You have nausea and vomiting.  You have diarrhea.  You have abdominal pain.  You have unusual weight loss or weight gain.  You have severe pain, and medicines do not help. Get help right away if:  You have severe chest pain.  You have trouble breathing.  You have shortness of breath.  You have trouble urinating.  You have darker urine than normal.  You have blood in your stools or have dark, tarry stools. Summary  Abdominal bloating means that the abdomen is swollen.  Common causes of abdominal bloating are swallowing air, constipation, and problems digesting food.  Avoid eating too much and avoid swallowing air.  Avoid foods that cause gas, carbonated drinks, hard candy, and chewing gum. This information is not intended to replace advice given to you by your health care provider. Make sure you discuss any questions you have with your health care provider. Document Revised: 01/10/2019 Document Reviewed: 10/24/2016 Elsevier Patient Education  2020 [employer].

## 2019-11-10 ENCOUNTER — Encounter: Payer: Self-pay | Admitting: Gastroenterology

## 2019-11-10 DIAGNOSIS — R1084 Generalized abdominal pain: Secondary | ICD-10-CM | POA: Insufficient documentation

## 2019-11-10 NOTE — Assessment & Plan Note (Signed)
Addressed under constipation.  

## 2019-11-11 IMAGING — CT CT VENOGRAM HEAD
3 of 6 series · 18 of 47 positions shown · IV contrast (Isovue)
Comparison: None.

CLINICAL DATA: Headaches.  Dural venous sinus thrombosis suspected.

EXAM:
CT VENOGRAM HEAD
TECHNIQUE: After administration of intravenous contrast in the venous phase
axial CT was acquired of the head and surrounding structures with
multiplanar reconstruction.
CONTRAST:  60mL OMNIPAQUE IOHEXOL 300 MG/ML  SOLN

[Series 4: coronal soft tissue · coronal · 0.29mm/px · 2 of 70 slices shown]
[im 24/70  brain]
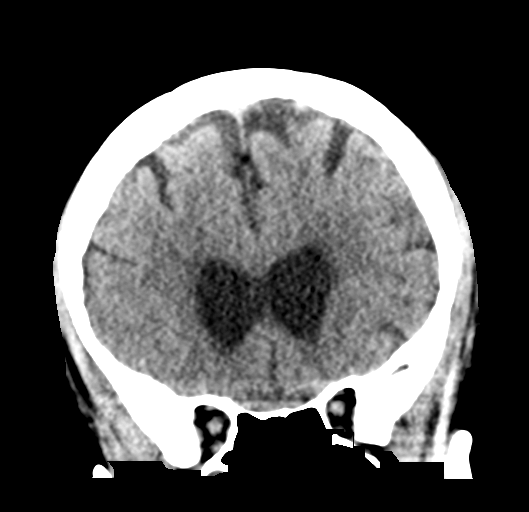
[im 47/70  brain]
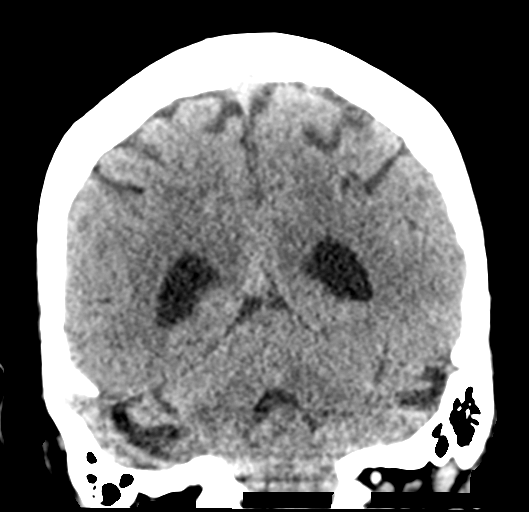

[Series 5: sagittal soft tissue · sagittal · 0.31mm/px · 1 of 50 slices shown]
[im 25/50  brain]
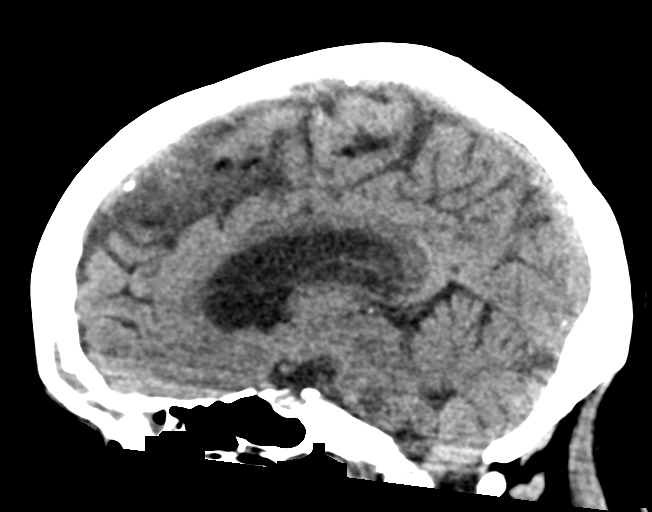

[Series 6: head venogram · axial · 0.43mm/px · z∈[+1766,+1898]mm · 15 of 74 slices shown]
[im 4/74  brain]
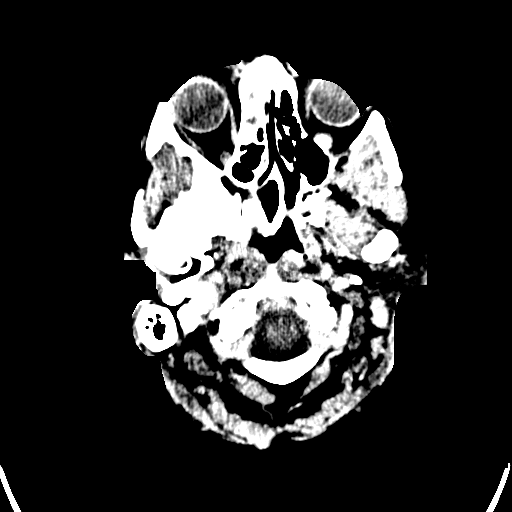
[im 8/74  bone]
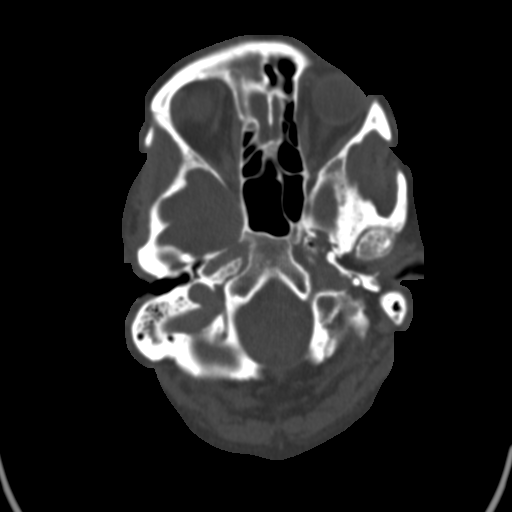
[im 15/74  brain]
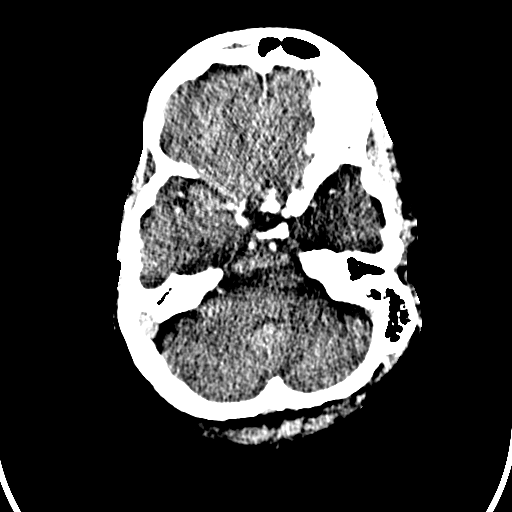
[im 19/74  bone]
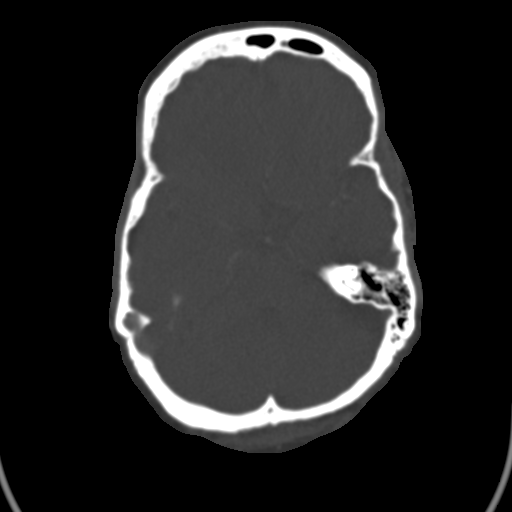
[im 22/74  brain]
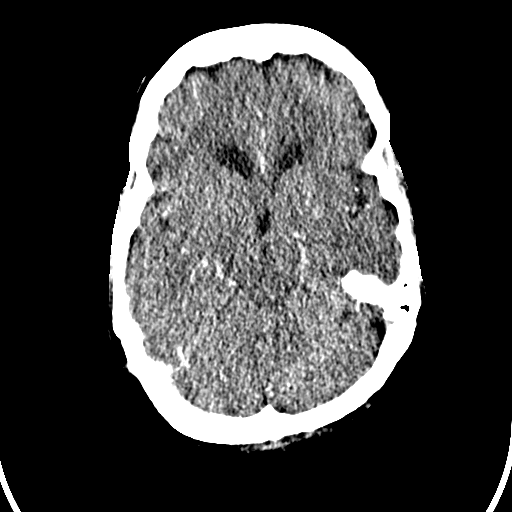
[im 26/74  bone]
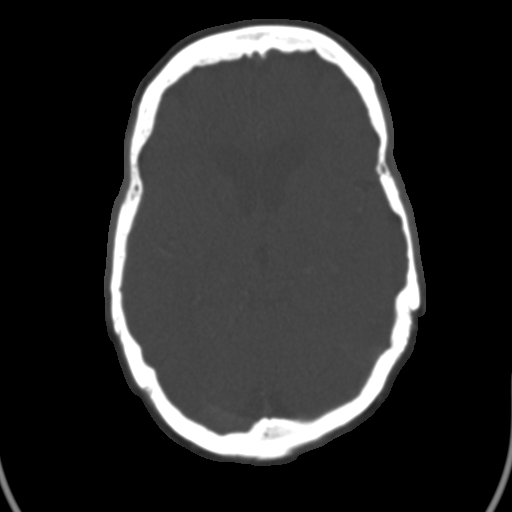
[im 33/74  brain]
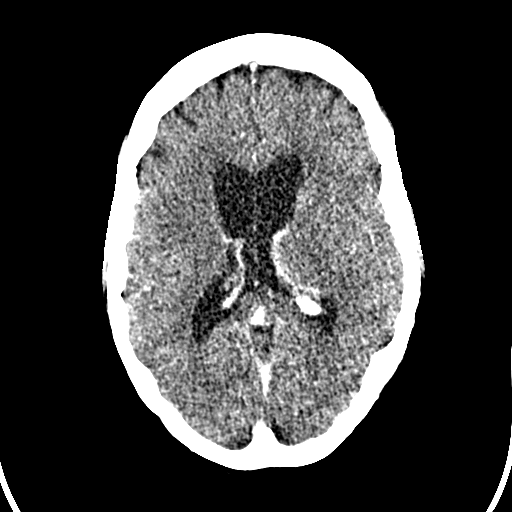
[im 37/74  bone]
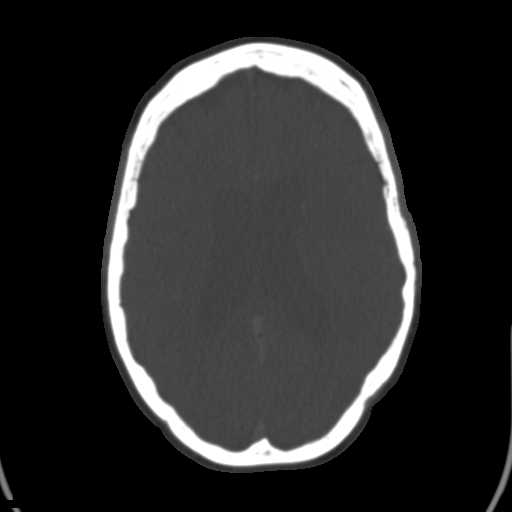
[im 41/74  brain]
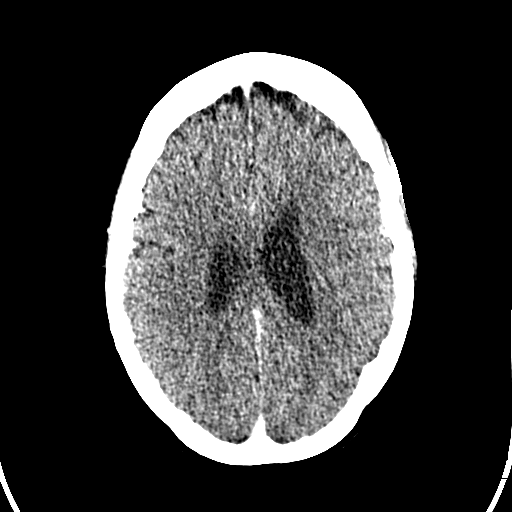
[im 48/74  bone]
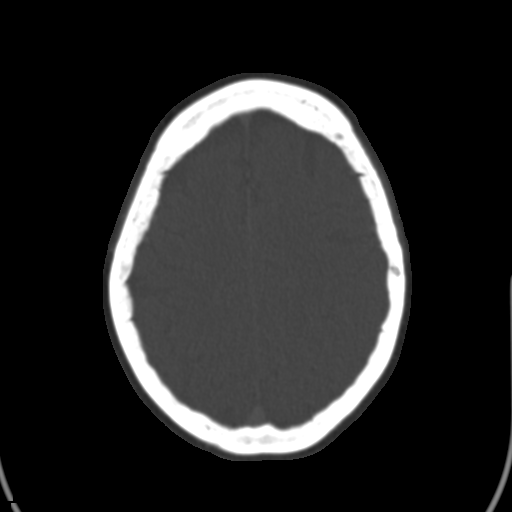
[im 52/74  brain]
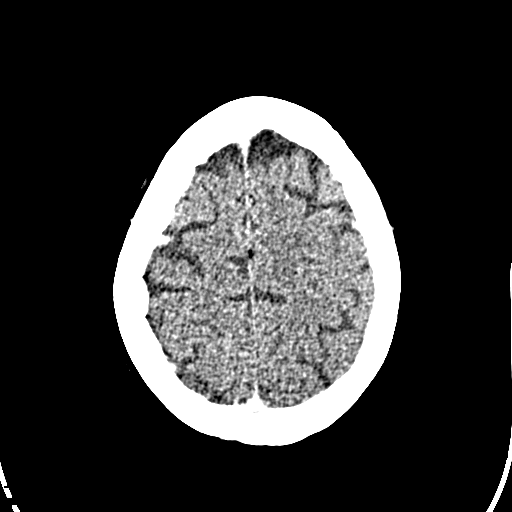
[im 55/74  bone]
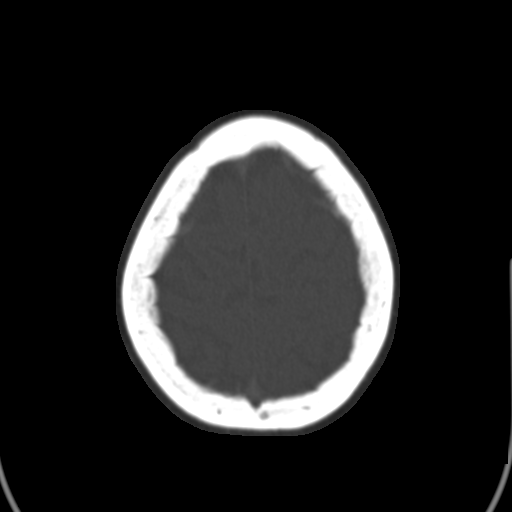
[im 59/74  brain]
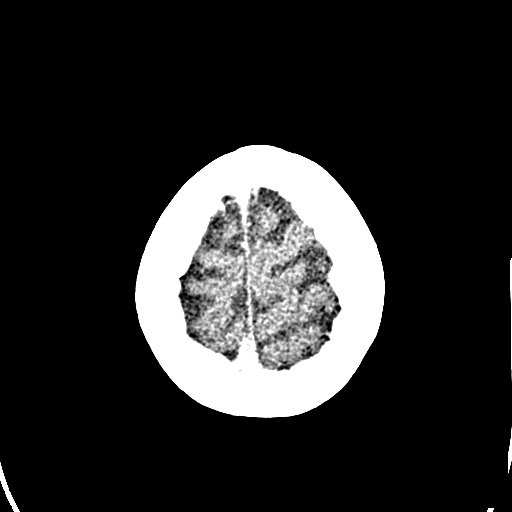
[im 66/74  bone]
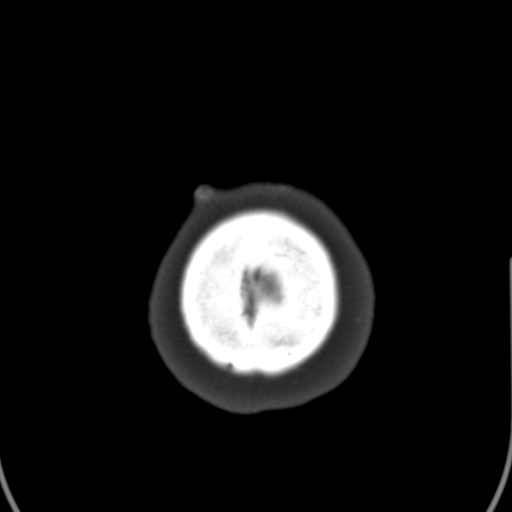
[im 70/74  brain]
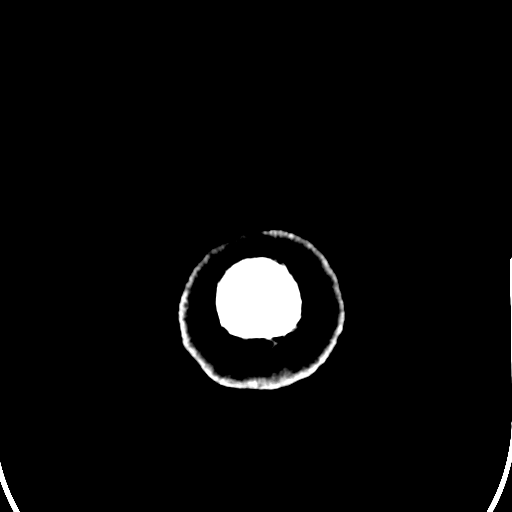

[18 of 47 positions shown; findings below may reference images not displayed]

FINDINGS: Brain: No stroke, hemorrhage, focal mass effect, extra-axial
collection, or herniation. No mild chronic microvascular ischemic
changes and parenchymal volume loss of the brain.

Vascular: Patent superior sagittal sinus, straight sinus, bilateral
transverse sinus, bilateral sigmoid sinus, bilateral upper internal
jugular veins. The left transverse sinus system is smaller than the
right transverse sinus system, normal variant.

Skull: Negative.

Paranasal sinuses: Partial opacification of right mastoid tip.
Normal aeration of left mastoid air cells. Opacification of the
right frontal sinus, right anterior ethmoid air cells, and the
partially visualized right maxillary sinus.

Orbits: Bilateral intra-ocular lens replacement.

Soft tissues: Right frontal region scalp nodule with calcifications
measuring up to 12 mm.
IMPRESSION: 1. No dural venous sinus thrombosis identified.
2. No acute intracranial abnormality.
3. Mild chronic microvascular ischemic changes and parenchymal
volume loss of the brain.
4. Right frontal, anterior ethmoid, and maxillary sinus
opacification. This is a right middle meatus obstructive pattern,
direct visualization recommended.
5. Right superior frontal region scalp nodule measuring up to 12 mm,
direct visualization recommended.

By: Jirouch Keprta M.D.

## 2019-11-14 NOTE — Progress Notes (Signed)
Cc'ed to pcp °

## 2020-02-07 NOTE — Progress Notes (Signed)
Referring Provider: Harvie Heck, MD Primary Care Physician:  Harvie Heck, MD Primary GI Physician: Dr. Jena Gauss  Chief Complaint  Patient presents with  . Constipation    better    HPI:   Jenny Garcia is a 79 y.o. female presenting today with a history of alternating constipation and diarrhea.  Apparently was diagnosed with IBS in the 1970s past and maintained on Bentyl for abdominal cramping.  She was initially seen in our office for the symptoms on 08/05/2019. Abdominal x-ray on 08/09/2019 with nonobstructed gas pattern with moderate to large feces in the colon.  She was advised to start MiraLAX 1 capful daily with goal of daily BMs.  She was to continue a lactose-free diet and avoid using Bentyl.   Last seen in our office 11/09/2019.  Diarrhea had resolved but continued struggling with constipation.  She had stopped taking MiraLAX as she felt it did not help.  Reported increased gas.  Not taking thyroid medication regularly due to this causing gas.  Typically with 1 BM a week that is hard.  Mild lower abdominal pain improved with a good BM.  Had taken Bentyl a couple times which improved abdominal pain.  Denies bright red blood per rectum or melena.  Avoiding green vegetables to try to limit gas.  Admitted to eating yogurt 1-2 times a week and rare cheese.  She is also drinking a lot of soda.  Suspected constipation was multifactorial in the setting of hypothyroidism with last TSH 5.3 in October 2020, chronic opioid use, underlying IBS.  Plan to try Amitiza 8 mcg twice daily (samples provided).  Requested progress report in 1-2 weeks.  Suspect that abdominal pain would improve with better management of constipation.  Advise she call and let us know if symptoms worsen and we would consider CT.  Also counseled to increase water intake and avoid gas producing foods.  No progress report received.  Reviewed most recent labs in care everywhere completed 02/02/2020. TSH 8.05 (H).  CMP with  creatinine 1.23, otherwise essentially normal.  CBC with slightly elevated but stable WBC at 11.3, otherwise within normal limits.  Hemoglobin A1c 5.3.  Today: Took Amitiza. Stopped taking them because her husband said they didn't work. Bowels are doing a lot better now. States she cut back on intake of food. Decreased portion sizes. Following a lactose diet for the most part. Having BMs daily to every other day. Not taking anything to help with bowel regularity. Stools are soft and formed. No diarrhea. No blood in the stool or black stools. No abdominal pain. Occasional gas. No nausea or vomiting.   No GERD or dysphagia.   Occasionally using bentyl but not regularly.   Past Medical History:  Diagnosis Date  . Acquired hypothyroidism   . Anxiety   . High cholesterol   . Hypertension   . IBS (irritable bowel syndrome)   . Insomnia   . Pelvic mass    simple cyst. CA-125 mildly elevated at 53.1. Following with hematology/oncology.     Past Surgical History:  Procedure Laterality Date  . ABDOMINAL HYSTERECTOMY    . BACK SURGERY     x3  . CATARACT EXTRACTION      Current Outpatient Medications  Medication Sig Dispense Refill  . ALPRAZolam (XANAX) 0.5 MG tablet Take 0.5 mg by mouth 2 (two) times daily as needed for anxiety or sleep.     Marland Kitchen aspirin EC 325 MG tablet Take 325 mg by mouth daily.     Marland Kitchen  budesonide (PULMICORT) 0.5 MG/2ML nebulizer solution Take 0.5 mg by nebulization 2 (two) times daily.    . candesartan (ATACAND) 8 MG tablet Take 8 mg by mouth daily.    . clotrimazole (LOTRIMIN) 1 % cream Apply 1 application topically as needed.    . diclofenac (VOLTAREN) 75 MG EC tablet TAKE ONE TABLET BY MOUTH 2  TIMES DAILY.    Marland Kitchen dicyclomine (BENTYL) 20 MG tablet Take 20 mg by mouth daily as needed for spasms.     Marland Kitchen diltiazem (TIAZAC) 360 MG 24 hr capsule Take 360 mg by mouth daily.    Marland Kitchen econazole nitrate 1 % cream Apply topically 2 (two) times daily.    . fluticasone (FLONASE) 50  MCG/ACT nasal spray USE TWO SPRAY(S) IN EACH NOSTRIL ONCE DAILY 16 g 0  . naloxone (NARCAN) nasal spray 4 mg/0.1 mL Place 1 spray into the nose.    . oxyCODONE-acetaminophen (PERCOCET/ROXICET) 5-325 MG tablet Take 1 tablet by mouth every 6 (six) hours as needed for moderate pain or severe pain.     Marland Kitchen spironolactone (ALDACTONE) 25 MG tablet Take 25 mg by mouth daily.    Marland Kitchen tiZANidine (ZANAFLEX) 4 MG tablet Take 4 mg by mouth every 6 (six) hours as needed for muscle spasms.    Marland Kitchen levothyroxine (SYNTHROID, LEVOTHROID) 25 MCG tablet Take 25 mcg by mouth daily before breakfast.      No current facility-administered medications for this visit.    Allergies as of 02/08/2020 - Review Complete 02/08/2020  Allergen Reaction Noted  . Acetaminophen Other (See Comments) 05/23/2011  . Penicillins Hives and Itching 12/11/2010  . Sulfa antibiotics  05/23/2011  . Gabapentin Other (See Comments) 05/27/2017  . Niacin and related Other (See Comments) 02/25/2012  . Pitavastatin Diarrhea and Rash 05/23/2011    Family History  Problem Relation Age of Onset  . Colon cancer Neg Hx     Social History   Socioeconomic History  . Marital status: Married    Spouse name: Not on file  . Number of children: Not on file  . Years of education: Not on file  . Highest education level: Not on file  Occupational History  . Not on file  Tobacco Use  . Smoking status: Never Smoker  . Smokeless tobacco: Never Used  Substance and Sexual Activity  . Alcohol use: Not Currently  . Drug use: Never  . Sexual activity: Not on file  Other Topics Concern  . Not on file  Social History Narrative  . Not on file   Social Determinants of Health   Financial Resource Strain:   . Difficulty of Paying Living Expenses:   Food Insecurity:   . Worried About Charity fundraiser in the Last Year:   . Arboriculturist in the Last Year:   Transportation Needs:   . Film/video editor (Medical):   Marland Kitchen Lack of Transportation  (Non-Medical):   Physical Activity:   . Days of Exercise per Week:   . Minutes of Exercise per Session:   Stress:   . Feeling of Stress :   Social Connections:   . Frequency of Communication with Friends and Family:   . Frequency of Social Gatherings with Friends and Family:   . Attends Religious Services:   . Active Member of Clubs or Organizations:   . Attends Archivist Meetings:   Marland Kitchen Marital Status:     Review of Systems: Gen: Denies fever, chills, presyncope, syncope. CV: Denies chest pain  or heart palpitations. Resp: Denies dyspnea at rest or cough. GI: See HPI Derm: Denies rash  Heme: See HPI  Physical Exam: BP (!) 183/73   Pulse 77   Temp (!) 97.3 F (36.3 C) (Temporal)   Ht 5' (1.524 m)   Wt 140 lb 12.8 oz (63.9 kg)   BMI 27.50 kg/m  General:   Alert and oriented. No distress noted. Pleasant and cooperative.  Walking with a cane.  Some difficulty getting on exam table due to back pain. Head:  Normocephalic and atraumatic. Eyes:  Conjuctiva clear without scleral icterus. Heart:  S1, S2 present without murmurs appreciated. Lungs:  Clear to auscultation bilaterally. No wheezes, rales, or rhonchi. No distress.  Abdomen:  +BS, soft, non-tender and non-distended. No rebound or guarding. No HSM or masses noted. Msk:  Symmetrical without gross deformities. Normal posture. Extremities:  Without edema. Neurologic:  Alert and  oriented x4 Psych: Normal mood and affect.

## 2020-02-08 ENCOUNTER — Other Ambulatory Visit: Payer: Self-pay

## 2020-02-08 ENCOUNTER — Ambulatory Visit: Payer: Medicare Other | Admitting: Gastroenterology

## 2020-02-08 ENCOUNTER — Encounter: Payer: Self-pay | Admitting: Gastroenterology

## 2020-02-08 ENCOUNTER — Encounter: Payer: Self-pay | Admitting: Internal Medicine

## 2020-02-08 VITALS — BP 183/73 | HR 77 | Temp 97.3°F | Ht 60.0 in | Wt 140.8 lb

## 2020-02-08 DIAGNOSIS — K5900 Constipation, unspecified: Secondary | ICD-10-CM | POA: Diagnosis not present

## 2020-02-08 NOTE — Patient Instructions (Signed)
I am glad you are feeling much better!  Continue drinking plenty of water to keep urine pale yellow to clear.   We will see you back in 6 months. Call with questions or concerns prior.   Ermalinda Memos, PA-C [facility]

## 2020-02-08 NOTE — Assessment & Plan Note (Addendum)
Constipation has improved/resolved. Had prescribed Amitiza 8 mcg BID at last visit in February 2021. Patient states she stopped medication because it didn't work well. Has been following a lactose free diet for the most part and has limited her portion sizes which seems to be working very well for her. Having soft, formed BMs daily to every other day. No abdominal pain or any other significant lower or upper GI symptoms.   Advised to continue monitoring, drink plenty of water to keep urine pale yellow to clear and follow up in 6 months. Will consider following as needed if she continues to do well.   Of note, we did discuss her hypothyroidism again today and the role it can play in constipation. She is still not taking her levothyroxine. I again urged her to start taking this medication.

## 2020-08-03 ENCOUNTER — Ambulatory Visit: Payer: Medicare Other | Admitting: Gastroenterology

## 2020-08-10 ENCOUNTER — Ambulatory Visit: Payer: Medicare Other | Admitting: Gastroenterology

## 2021-06-07 ENCOUNTER — Inpatient Hospital Stay
Admission: RE | Admit: 2021-06-07 | Discharge: 2021-08-06 | Disposition: E | Payer: Medicare Other | Source: Ambulatory Visit | Attending: Internal Medicine | Admitting: Internal Medicine

## 2021-06-08 IMAGING — CR DG ABDOMEN 2V
2 series · 2 of 2 positions shown · non-contrast
Comparison: None.

CLINICAL DATA: Abdominal cramping diarrhea

EXAM:
ABDOMEN - 2 VIEW

[w erect]
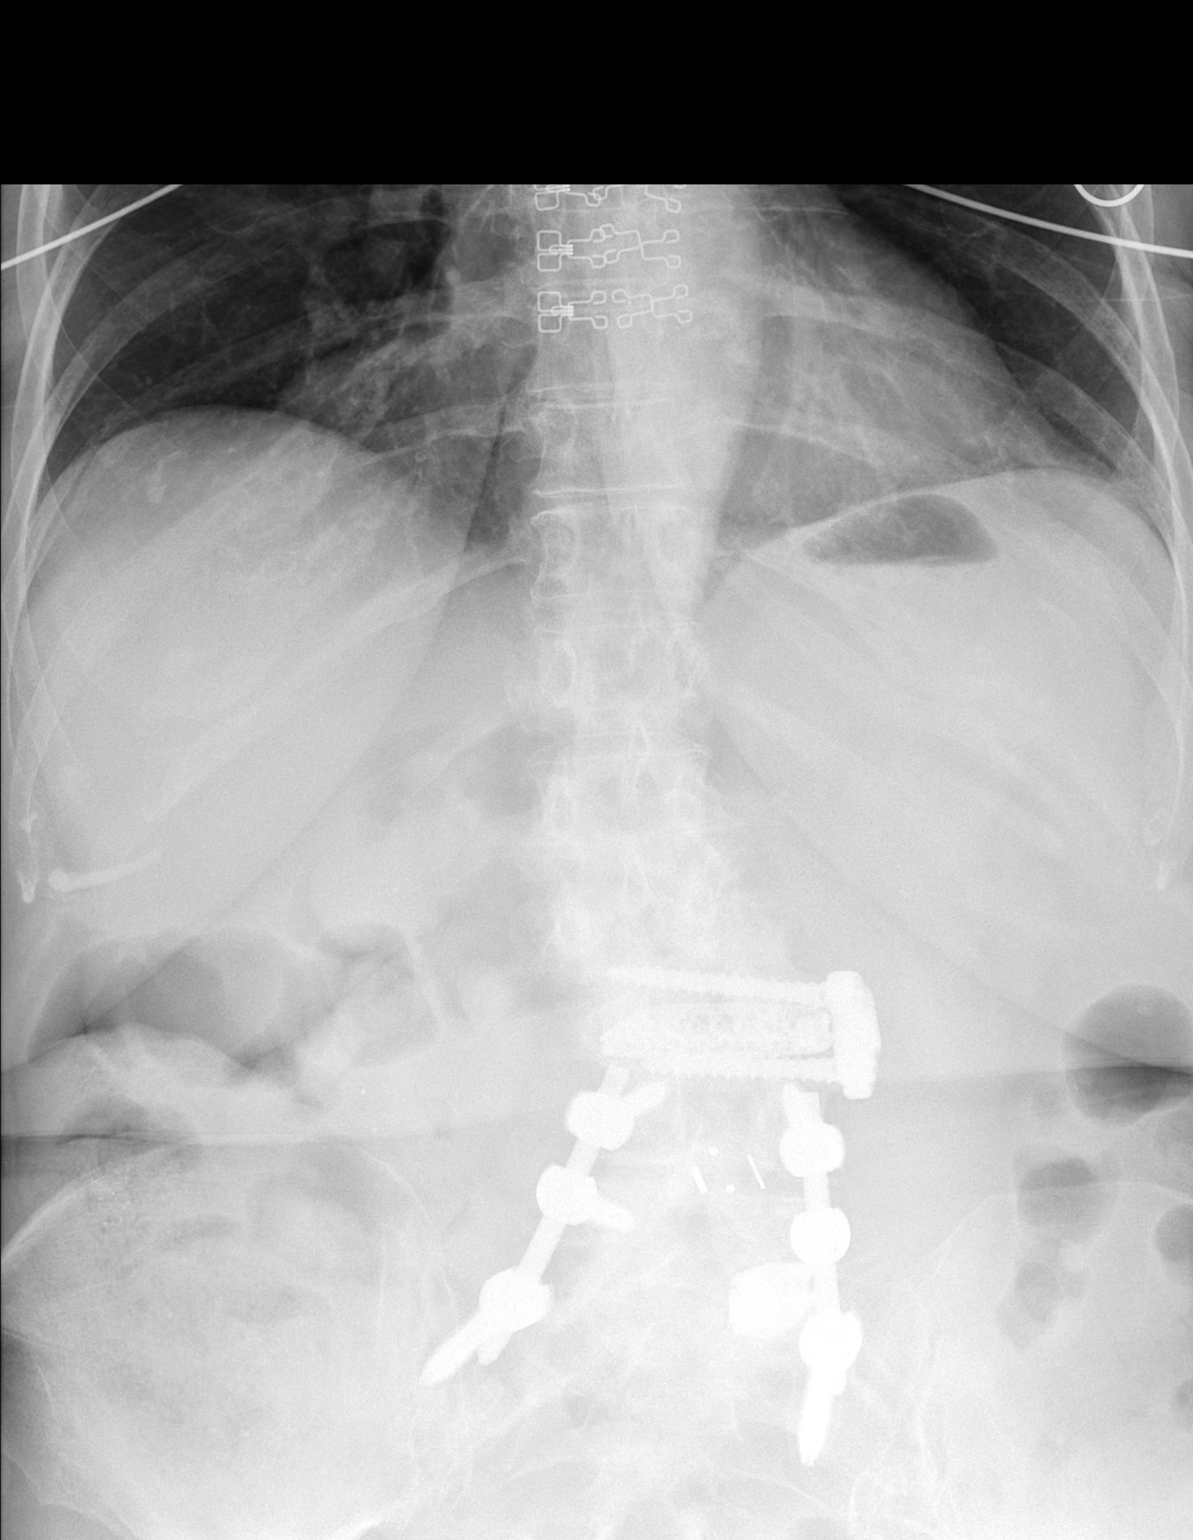

[t ap supine]
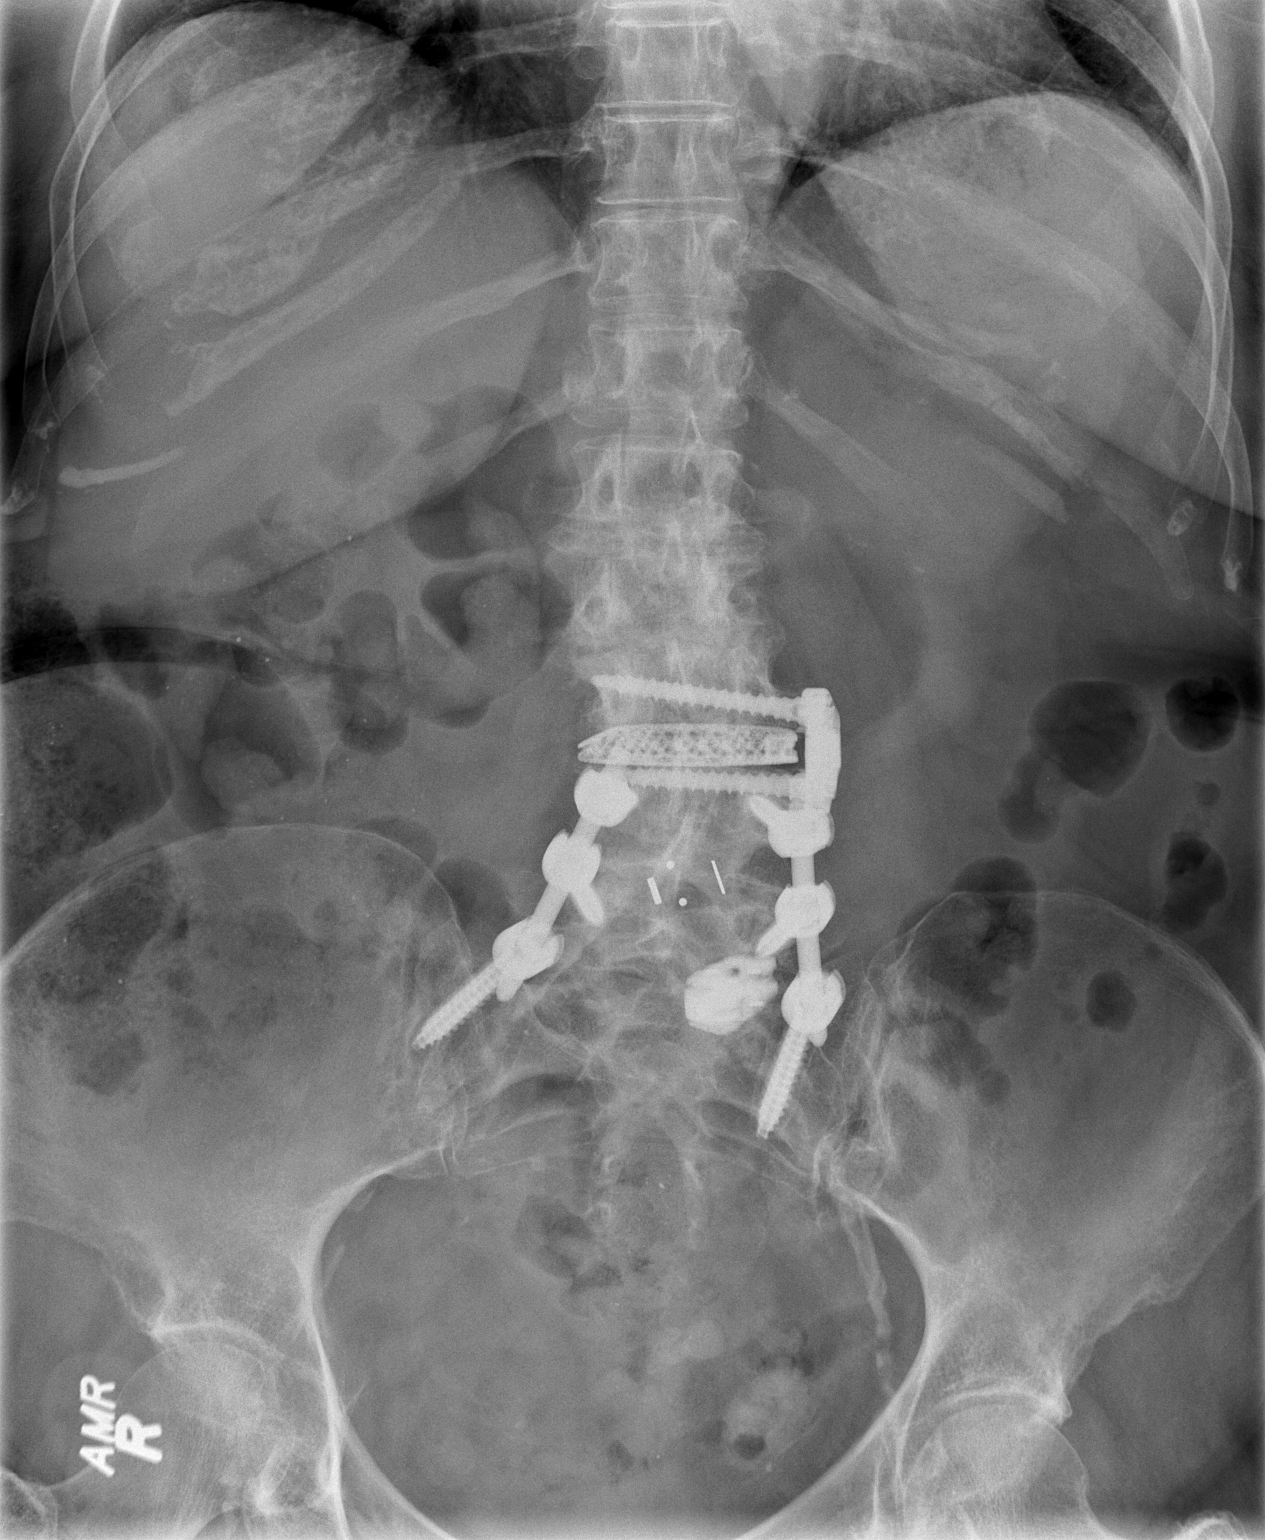

[2 of 2 positions shown; findings below may reference images not displayed]

FINDINGS: Lung bases are clear. Scoliosis of the spine with hardware in the
lumbar spine. Nonobstructed bowel-gas pattern with moderate to large
feces in the colon. No radiopaque calculi.
IMPRESSION: Nonobstructed gas pattern with moderate to large fecal debris in the
colon

## 2021-06-11 ENCOUNTER — Encounter: Payer: Self-pay | Admitting: Adult Health

## 2021-06-11 ENCOUNTER — Non-Acute Institutional Stay (SKILLED_NURSING_FACILITY): Payer: Medicare Other | Admitting: Adult Health

## 2021-06-11 DIAGNOSIS — E43 Unspecified severe protein-calorie malnutrition: Secondary | ICD-10-CM

## 2021-06-11 DIAGNOSIS — N1831 Chronic kidney disease, stage 3a: Secondary | ICD-10-CM | POA: Diagnosis not present

## 2021-06-11 DIAGNOSIS — I129 Hypertensive chronic kidney disease with stage 1 through stage 4 chronic kidney disease, or unspecified chronic kidney disease: Secondary | ICD-10-CM

## 2021-06-11 DIAGNOSIS — E039 Hypothyroidism, unspecified: Secondary | ICD-10-CM

## 2021-06-11 DIAGNOSIS — D631 Anemia in chronic kidney disease: Secondary | ICD-10-CM

## 2021-06-11 DIAGNOSIS — M8949 Other hypertrophic osteoarthropathy, multiple sites: Secondary | ICD-10-CM

## 2021-06-11 DIAGNOSIS — F339 Major depressive disorder, recurrent, unspecified: Secondary | ICD-10-CM

## 2021-06-11 DIAGNOSIS — K581 Irritable bowel syndrome with constipation: Secondary | ICD-10-CM | POA: Diagnosis not present

## 2021-06-11 DIAGNOSIS — M159 Polyosteoarthritis, unspecified: Secondary | ICD-10-CM

## 2021-06-11 DIAGNOSIS — N183 Chronic kidney disease, stage 3 unspecified: Secondary | ICD-10-CM

## 2021-06-11 DIAGNOSIS — G912 (Idiopathic) normal pressure hydrocephalus: Secondary | ICD-10-CM

## 2021-06-11 DIAGNOSIS — M150 Primary generalized (osteo)arthritis: Secondary | ICD-10-CM

## 2021-06-11 NOTE — Progress Notes (Signed)
Location:  [facility] Room Number: 155-P Place of Service:  SNF (31)   CODE STATUS: DNR  Allergies  Allergen Reactions   Acetaminophen Other (See Comments)    Increases LFT's   Penicillins Hives and Itching   Sulfa Antibiotics     Reaction unreported   Gabapentin Other (See Comments)    confusion   Niacin And Related Other (See Comments)    Body felt like it was burning   Pitavastatin Diarrhea and Rash    Chief Complaint  Patient presents with   Hospitalization Follow-up        HPI:  She is a 80 year old woman who has been hospitalized from 05-08-21 through 06/09/2021. Her medical history includes: hypertension chronic pain syndrome; hypothyroidism IBS. She presented to [facility] from OSH for concerns of encephalopathy and possible NPH. Several weeks prior to her hospitalization she fell and broke her wrist. She was transferred to a rehab center. She had little improvement in rehab; and did decline. She tested positive for covid. She then returned back to the hospital for confusion and weakness. She was found to have a UTI and pneumonia with hypoxia. She required supplemental 02 up to 7 liters. She was treated for covid pneumonia was treated with a coarse of decadron; persistent leukocytosis (sepsis) was treated with broad spectrum abt. Her urine culture on 05-28-21 grew out pseudomonas. She has had recent c-diff infection was started on dificid; was started diflucan for yeast in her urine. She has hypernatremia and acuate kidney injury was initially treated with IVF and tube feeding. She was seen by palliative care; the tube feeding was stopped. She has been transitioned to comfort care.  There was concern for NPH due to imaging. Neurosurgery was consulted family has decided against VP shunt. This does represent a long term placement for her. There are no reports of uncontrolled pain. She is in bed at this time. She denies any anxiety. She will continue to be  followed for her chronic illnesses including: Irritable bowel syndrome with constipation:   Acquired hypothyroidism:  Stage 3a chronic kidney disease: . Benign hypertension with chronic kidney disease stage III:  Past Medical History:  Diagnosis Date   Acquired hypothyroidism    Anxiety    High cholesterol    Hypertension    IBS (irritable bowel syndrome)    Insomnia    Pelvic mass    simple cyst. CA-125 mildly elevated at 53.1. Following with hematology/oncology.     Past Surgical History:  Procedure Laterality Date   ABDOMINAL HYSTERECTOMY     BACK SURGERY     x3   CATARACT EXTRACTION      Social History   Socioeconomic History   Marital status: Married    Spouse name: Not on file   Number of children: Not on file   Years of education: Not on file   Highest education level: Not on file  Occupational History   Not on file  Tobacco Use   Smoking status: Never   Smokeless tobacco: Never  Vaping Use   Vaping Use: Never used  Substance and Sexual Activity   Alcohol use: Not Currently   Drug use: Never   Sexual activity: Not on file  Other Topics Concern   Not on file  Social History Narrative   Not on file   Social Determinants of Health   Financial Resource Strain: Not on file  Food Insecurity: Not on file  Transportation Needs: Not on file  Physical Activity: Not on file  Stress: Not on file  Social Connections: Not on file  Intimate Partner Violence: Not on file   Family History  Problem Relation Age of Onset   Colon cancer Neg Hx       VITAL SIGNS BP (!) 172/81   Pulse 88   Temp (!) 97.1 F (36.2 C)   Resp 20   Ht 5' (1.524 m)   Wt 115 lb (52.2 kg)   SpO2 95%   BMI 22.46 kg/m   Outpatient Encounter Medications as of 06/11/2021  Medication Sig   Balsam [place]-Castor Oil (VENELEX) OINT Apply 1 application topically as directed. Apply to bilateral buttocks, sacrum & coccyx qshift for prevention   diltiazem (TIAZAC) 360 MG 24 hr capsule Take 360  mg by mouth daily.   levothyroxine (SYNTHROID) 50 MCG tablet Take 50 mcg by mouth daily before breakfast.   lidocaine (LMX) 4 % cream Apply 1 application topically 3 (three) times daily as needed (apply to painful oints of knees and arms).   ondansetron (ZOFRAN-ODT) 4 MG disintegrating tablet Take 4 mg by mouth every 4 (four) hours as needed for nausea.   oxyCODONE (OXY IR/ROXICODONE) 5 MG immediate release tablet Take 5 mg by mouth every 6 (six) hours as needed for severe pain.   polyethylene glycol (MIRALAX / GLYCOLAX) 17 g packet Take 17 g by mouth daily. Mix with 4 oz of liquid or drink   sennosides-docusate sodium (SENOKOT-S) 8.6-50 MG tablet Take 2 tablets by mouth daily. 10 am   UNABLE TO FIND Diet: Mechanical Soft   venlafaxine XR (EFFEXOR-XR) 37.5 MG 24 hr capsule Take 37.5 mg by mouth daily with breakfast. 10 am, DO NOT CRUSH   No facility-administered encounter medications on file as of 06/11/2021.     SIGNIFICANT DIAGNOSTIC EXAMS  TODAY  11-23-20: hgb a1c 5.4 05-25-21: iron 23; tibc 195 05-26-21: uric acid 6.3 06-04-21: tsh 5.29 06-05-21; wbc 7.7; hgb 7.9; hct 25.2; mcv 102; plt 442; glucose 85; bun 19; creat 1.00 ;k+ 3.8; na++ 139; ca 9.7; GFR 57; liver normal albumin 2.3   Review of Systems  Reason unable to perform ROS: confusion.   Physical Exam Constitutional:      General: She is not in acute distress.    Appearance: She is cachectic. She is not diaphoretic.  Neck:     Thyroid: No thyromegaly.  Cardiovascular:     Rate and Rhythm: Normal rate and regular rhythm.     Pulses: Normal pulses.     Heart sounds: Normal heart sounds.  Pulmonary:     Effort: Pulmonary effort is normal. No respiratory distress.     Breath sounds: Normal breath sounds.  Abdominal:     General: Bowel sounds are normal. There is no distension.     Palpations: Abdomen is soft.     Tenderness: There is no abdominal tenderness.  Musculoskeletal:     Cervical back: Neck supple.     Right  lower leg: No edema.     Left lower leg: No edema.     Comments: Is able to move all extremities   Lymphadenopathy:     Cervical: No cervical adenopathy.  Skin:    General: Skin is warm and dry.     Comments: Right lateral ankle: 2.6 x 3.0 cm Right heel: 5 x 6 cm  Neurological:     Mental Status: She is alert. Mental status is at baseline.  Psychiatric:  Mood and Affect: Mood normal.     ASSESSMENT/ PLAN:  TODAY  Irritable bowel syndrome with constipation: is stable will continue miralax daily and senna s 2 tabs daily   2.  Acquired hypothyroidism: is stable tsh 5.29 will continue synthroid 50 mcg daily   3. Stage 3a chronic kidney disease: is stable: bun 19; creat 1.00; GFR 57  4. Benign hypertension with chronic kidney disease stage III: is stable b/p 172/81 will continue tiaz cd 380 mg daily   5. NPH (normal pressure hydrocephalus) family has declined VP shunt  6. Anemia due to stage 3a chronic kidney disease: is without change: hgb 7.9 is status post 1 unit prbc while in hospital. Will monitor   7. Major depression recurrent chronic: is stable will continue effexor xr 37.5 mg daily   8. Primary osteoarthritis involving multiple sites: is stable will continue lidocaine 4% cream to knees and arms three times daily as needed; oxycodone 5 mg every 6 hours as needed for severe pain.   9. Protein calorie malnutrition severe: is without change albumin 2.3 will begin prostat 30 mL with meals.    Synthia Innocent NP [facility]  Contact [phone] Monday through Friday 8am- 5pm  After hours call [phone]

## 2021-06-12 ENCOUNTER — Encounter: Payer: Self-pay | Admitting: Internal Medicine

## 2021-06-12 ENCOUNTER — Non-Acute Institutional Stay (SKILLED_NURSING_FACILITY): Payer: Medicare Other | Admitting: Internal Medicine

## 2021-06-12 DIAGNOSIS — R627 Adult failure to thrive: Secondary | ICD-10-CM | POA: Diagnosis not present

## 2021-06-12 DIAGNOSIS — N1831 Chronic kidney disease, stage 3a: Secondary | ICD-10-CM | POA: Diagnosis not present

## 2021-06-12 DIAGNOSIS — G912 (Idiopathic) normal pressure hydrocephalus: Secondary | ICD-10-CM

## 2021-06-12 DIAGNOSIS — J1281 Pneumonia due to SARS-associated coronavirus: Secondary | ICD-10-CM | POA: Diagnosis not present

## 2021-06-12 DIAGNOSIS — D631 Anemia in chronic kidney disease: Secondary | ICD-10-CM

## 2021-06-12 NOTE — Patient Instructions (Signed)
See assessment and plan under each diagnosis in the problem list and acutely for this visit 

## 2021-06-12 NOTE — Assessment & Plan Note (Addendum)
Residual neurocognitive deficits present . BIMS pending. PT/OT and Nutrition consults @ SNF

## 2021-06-12 NOTE — Assessment & Plan Note (Signed)
Neurosurgical intervention declined by family. Palliative care pursued.

## 2021-06-12 NOTE — Progress Notes (Signed)
NURSING HOME LOCATION:  [facility] ROOM NUMBER:  [id number]  CODE STATUS:  DNR  PCP:  Harvie Heck MD  This is a comprehensive admission note to this SNFperformed on this date less than 30 days from date of admission. Included are preadmission medical/surgical history; reconciled medication list; family history; social history and comprehensive review of systems.  Corrections and additions to the records were documented. Comprehensive physical exam was also performed. Additionally a clinical summary was entered for each active diagnosis pertinent to this admission in the Problem List to enhance continuity of care.  HPI: Medical history & hospital course is long and complicated.  Most recently she had been hospitalized at [facility] having been transferred from an outside hospital with encephalopathy and possible normal pressure hydrocephalus. She originally had fallen 6/24 and fractured her wrist.  Because of progressive weakness over several days ,she was admitted to the outside hospital.  She was found to have a UTI complicated by C. difficile.  Both infections were treated and she was discharged to rehab.  At the SNF she had progressive decline & screened positive for COVID.  On 8/2 she was readmitted to [facility]  with confusion and weakness.  Clinically she had UTI and possibly pneumonia with hypoxia.  CT revealed enlarged ventricles and she was transferred to [facility].  She was awake but not alert.  She responded weakly to external stimuli.  At that time she was on 7 L of O2.  She was treated with Decadron and weaned to room air.  She had persistent leukocytosis in the context of clinical sepsis.  Because of the recent C. difficile infection she was given Dificid.  After 4 days of this treatment C. difficile testing was negative.  She did receive fluconazole for Candida in her urine. On 8/23 Pseudomonas UTI was documented and she was placed on oral Cipro for  7 days and prophylactic oral Vanc. Course was complicated by hypernatremia, hyperkalemia and AKI attributed to dehydration.  She received IV fluids and tube feedings.  Palliative care consulted and the tube feedings were discontinued after consultation with the family.  Hospice was declined by the family but palliative care monitoring was to continue. Course also was associated with macrocytic anemia and iron deficiency with a hemoglobin nadir of 6.7.  B12 and folate levels were normal.  She received 1 unit of packed red cells and iron infusion.  She was noted to have severe protein/caloric malnutrition with albumin of 2.3.  TSH was high normal at 5.29. She was transferred to this SNF for PT/OT rehab to see if she could return home.  Past medical and surgical history: Includes hypothyroidism, dyslipidemia, essential hypertension, and IBS. Surgeries and procedures include abdominal hysterectomy and back surgery x3.  Social history: She has never smoked; she does not drink alcohol currently.  Family history: Noncontributory due to advanced age.   Review of systems: Clinical neurocognitive deficits made validity of responses questionable & preventing ROS completion. Date given as June 27, 2021.  She cannot remember why she was in the hospital ; but stated that it had "something to do with my legs".  She stated that she felt "rough and exhausted".  She declared "I take a lot of medication & I have not had any today".  She confabulated that "they sent me all over the place.  Physical exam:  Pertinent or positive findings: Hair is disheveled.  Pallor is suggested.  Eyebrows are decreased.  The lower eyelids  are slightly edematous.  Breath sounds are decreased.  S1 is slightly accentuated.  Pedal pulses are decreased especially dorsalis pedis pulses.  Feet are in booties.  There is no opposition in the right upper extremity and minimal in the lower extremities.  Her fingers were flexed and she complained  of pain when I extended them passively.  The right hand is edematous.  She has a flat keratotic lesion at the base  of the right index finger.  She has irregular light brown hyperpigmentation over the right cheek and jaw.   Dressing is noted at the ankle.  She has a coarse tremor of her hands.  General appearance: no acute distress, increased work of breathing is present.   Lymphatic: No lymphadenopathy about the head, neck, axilla. Eyes: No conjunctival inflammation or lid edema is present. There is no scleral icterus. Ears:  External ear exam shows no significant lesions or deformities.   Nose:  External nasal examination shows no deformity or inflammation. Nasal mucosa are pink and moist without lesions, exudates Neck:  No thyromegaly, masses, tenderness noted.    Heart:  Normal rate and regular rhythm. S2 normal without gallop, murmur, click, rub.  Lungs:  without wheezes, rhonchi, rales, rubs. Abdomen: Bowel sounds are normal.  Abdomen is soft and nontender with no organomegaly, hernias, masses. GU: Deferred  Extremities:  No cyanosis, clubbing, edema. Neurologic exam:  Balance, Rhomberg, finger to nose testing could not be completed due to clinical state Skin: Warm & dry w/o tenting.  See clinical summary under each active problem in the Problem List with associated updated therapeutic plan

## 2021-06-12 NOTE — Assessment & Plan Note (Signed)
Nadir Hgb 6.7 ,S/P 1 u PRBC & iron infusion   Anemia improved worse  No bleeding dyscrasias reported by Staff Monitor CBC

## 2021-06-12 NOTE — Assessment & Plan Note (Signed)
Excellent oxygenation @ present No abnormal auscultatory sounds on exam

## 2021-06-14 ENCOUNTER — Encounter (HOSPITAL_COMMUNITY)
Admission: RE | Admit: 2021-06-14 | Discharge: 2021-06-14 | Disposition: A | Discharge status: Home / Self Care | Payer: Medicare Other | Source: Skilled Nursing Facility | Attending: Adult Health | Admitting: Adult Health

## 2021-06-14 ENCOUNTER — Other Ambulatory Visit: Payer: Self-pay | Admitting: Adult Health

## 2021-06-14 ENCOUNTER — Non-Acute Institutional Stay (SKILLED_NURSING_FACILITY): Payer: Medicare Other | Admitting: Adult Health

## 2021-06-14 ENCOUNTER — Encounter: Payer: Self-pay | Admitting: Adult Health

## 2021-06-14 DIAGNOSIS — I129 Hypertensive chronic kidney disease with stage 1 through stage 4 chronic kidney disease, or unspecified chronic kidney disease: Secondary | ICD-10-CM | POA: Insufficient documentation

## 2021-06-14 DIAGNOSIS — E876 Hypokalemia: Secondary | ICD-10-CM | POA: Diagnosis not present

## 2021-06-14 DIAGNOSIS — M4716 Other spondylosis with myelopathy, lumbar region: Secondary | ICD-10-CM

## 2021-06-14 LAB — BASIC METABOLIC PANEL
Anion gap: 8 (ref 5–15)
BUN: 18 mg/dL (ref 8–23)
CO2: 26 mmol/L (ref 22–32)
Calcium: 8.6 mg/dL — ABNORMAL LOW (ref 8.9–10.3)
Chloride: 101 mmol/L (ref 98–111)
Creatinine, Ser: 0.78 mg/dL (ref 0.44–1.00)
GFR, Estimated: >60 mL/min (ref >60)
Glucose, Bld: 91 mg/dL (ref 70–99)
Potassium: 2.9 mmol/L — ABNORMAL LOW (ref 3.5–5.1)
Sodium: 135 mmol/L (ref 135–145)

## 2021-06-14 LAB — CBC
HCT: 32.3 % — ABNORMAL LOW (ref 36.0–46.0)
Hemoglobin: 10.4 g/dL — ABNORMAL LOW (ref 12.0–15.0)
MCH: 32.7 pg (ref 26.0–34.0)
MCHC: 32.2 g/dL (ref 30.0–36.0)
MCV: 101.6 fL — ABNORMAL HIGH (ref 80.0–100.0)
Platelets: 446 10*3/uL — ABNORMAL HIGH (ref 150–400)
RBC: 3.18 MIL/uL — ABNORMAL LOW (ref 3.87–5.11)
RDW: 17.1 % — ABNORMAL HIGH (ref 11.5–15.5)
WBC: 13.8 10*3/uL — ABNORMAL HIGH (ref 4.0–10.5)
nRBC: 0 % (ref 0.0–0.2)

## 2021-06-14 MED ORDER — TRAMADOL HCL 50 MG PO TABS: 25.0000 mg | ORAL_TABLET | Freq: Every day | ORAL | 0 refills | Status: AC | PRN

## 2021-06-14 NOTE — Progress Notes (Signed)
Location:  [facility] Room Number: 155-P Place of Service:  SNF (31)   CODE STATUS: DNR  Allergies  Allergen Reactions   Acetaminophen Other (See Comments)    Increases LFT's   Penicillins Hives and Itching   Sulfa Antibiotics     Reaction unreported   Gabapentin Other (See Comments)    confusion   Niacin And Related Other (See Comments)    Body felt like it was burning   Pitavastatin Diarrhea and Rash    Chief Complaint  Patient presents with   Acute Visit    Pian management     HPI:  She is having pain with therapy. The is interfering with her ability to participate in therapy. She tells me that her joints and back hurts. The pain is achy in nature. Her k+ level is low at 2.9.   Past Medical History:  Diagnosis Date   Acquired hypothyroidism    Anxiety    High cholesterol    Hypertension    IBS (irritable bowel syndrome)    Insomnia    Pelvic mass    simple cyst. CA-125 mildly elevated at 53.1. Following with hematology/oncology.     Past Surgical History:  Procedure Laterality Date   ABDOMINAL HYSTERECTOMY     BACK SURGERY     x3   CATARACT EXTRACTION      Social History   Socioeconomic History   Marital status: Married    Spouse name: Not on file   Number of children: Not on file   Years of education: Not on file   Highest education level: Not on file  Occupational History   Not on file  Tobacco Use   Smoking status: Never   Smokeless tobacco: Never  Vaping Use   Vaping Use: Never used  Substance and Sexual Activity   Alcohol use: Not Currently   Drug use: Never   Sexual activity: Not on file  Other Topics Concern   Not on file  Social History Narrative   Not on file   Social Determinants of Health   Financial Resource Strain: Not on file  Food Insecurity: Not on file  Transportation Needs: Not on file  Physical Activity: Not on file  Stress: Not on file  Social Connections: Not on file  Intimate Partner  Violence: Not on file   Family History  Problem Relation Age of Onset   Colon cancer Neg Hx       VITAL SIGNS BP (!) 155/81   Pulse 83   Temp 97.8 F (36.6 C)   Resp 20   Ht 5' (1.524 m)   Wt 115 lb (52.2 kg)   SpO2 97%   BMI 22.46 kg/m   Outpatient Encounter Medications as of 06/14/2021  Medication Sig   Amino Acids-Protein Hydrolys (FEEDING SUPPLEMENT, PRO-STAT SUGAR FREE 64,) LIQD Take 30 mLs by mouth 3 (three) times daily with meals. 8 am, 12 pm, 6 pm   Balsam [place]-Castor Oil (VENELEX) OINT Apply 1 application topically as directed. Apply to bilateral buttocks, sacrum & coccyx qshift for prevention   diltiazem (CARDIZEM CD) 240 MG 24 hr capsule Take 240 mg by mouth daily. At 10 am   levothyroxine (SYNTHROID) 50 MCG tablet Take 50 mcg by mouth daily before breakfast.   lidocaine (LMX) 4 % cream Apply 1 application topically 3 (three) times daily as needed (apply to painful oints of knees and arms).   ondansetron (ZOFRAN-ODT) 4 MG disintegrating tablet Take 4 mg by  mouth every 4 (four) hours as needed for nausea.   polyethylene glycol (MIRALAX / GLYCOLAX) 17 g packet Take 17 g by mouth daily. Mix with 4 oz of liquid or drink   sennosides-docusate sodium (SENOKOT-S) 8.6-50 MG tablet Take 2 tablets by mouth daily. 10 am   UNABLE TO FIND Diet: Mechanical Soft   venlafaxine XR (EFFEXOR-XR) 37.5 MG 24 hr capsule Take 37.5 mg by mouth daily with breakfast. 10 am, DO NOT CRUSH   [DISCONTINUED] diltiazem (TIAZAC) 360 MG 24 hr capsule Take 360 mg by mouth daily.   [DISCONTINUED] oxyCODONE (OXY IR/ROXICODONE) 5 MG immediate release tablet Take 5 mg by mouth every 6 (six) hours as needed for severe pain.   No facility-administered encounter medications on file as of 06/14/2021.     SIGNIFICANT DIAGNOSTIC EXAMS  LABS REVIEWED PREVIOUS   11-23-20: hgb a1c 5.4 05-25-21: iron 23; tibc 195 05-26-21: uric acid 6.3 06-04-21: tsh 5.29 06-05-21; wbc 7.7; hgb 7.9; hct 25.2; mcv 102; plt 442;  glucose 85; bun 19; creat 1.00 ;k+ 3.8; na++ 139; ca 9.7; GFR 57; liver normal albumin 2.3   TODAY  9-9-2: glucose 91; bun 18; creat 0.78; k+ 2.9; na++ 135; ca 8.6; GFR >60  Review of Systems  Constitutional:  Negative for malaise/fatigue.  Respiratory:  Negative for cough and shortness of breath.   Cardiovascular:  Negative for chest pain.  Gastrointestinal:  Negative for constipation.  Musculoskeletal:  Positive for back pain and joint pain. Negative for myalgias.  Skin: Negative.   Psychiatric/Behavioral:  The patient is not nervous/anxious.    Physical Exam Constitutional:      General: She is not in acute distress.    Appearance: She is well-developed. She is not diaphoretic.     Comments: thin  Neck:     Thyroid: No thyromegaly.  Cardiovascular:     Rate and Rhythm: Normal rate and regular rhythm.     Pulses: Normal pulses.     Heart sounds: Normal heart sounds.  Pulmonary:     Effort: Pulmonary effort is normal. No respiratory distress.     Breath sounds: Normal breath sounds.  Abdominal:     General: Bowel sounds are normal. There is no distension.     Palpations: Abdomen is soft.     Tenderness: There is no abdominal tenderness.  Musculoskeletal:     Cervical back: Neck supple.     Right lower leg: No edema.     Left lower leg: No edema.     Comments: Is able to move all extremities    Lymphadenopathy:     Cervical: No cervical adenopathy.  Skin:    General: Skin is warm and dry.     Comments: Right lateral ankle: 2.6 x 3.0 cm Right heel: 5 x 6 cm   Neurological:     Mental Status: She is alert. Mental status is at baseline.  Psychiatric:        Mood and Affect: Mood normal.     ASSESSMENT/ PLAN:  TODAY  Hypokalemia Osteoarthritis of lumbar spine with myelopathy  Will give 40 meq k+ three times today then 20 meq daily will check k+ on 06-17-21 Will begin ultram 25 mg daily prior to therapy. Will monitor her status.    Synthia Innocent NP [facility]  Adult Medicine  Contact [phone] Monday through Friday 8am- 5pm  After hours call [phone]

## 2021-06-17 LAB — POTASSIUM: Potassium: 4 mmol/L (ref 3.5–5.1)

## 2021-06-19 DIAGNOSIS — E876 Hypokalemia: Secondary | ICD-10-CM | POA: Insufficient documentation

## 2021-06-20 ENCOUNTER — Non-Acute Institutional Stay (SKILLED_NURSING_FACILITY): Payer: Medicare Other | Admitting: Adult Health

## 2021-06-20 ENCOUNTER — Encounter: Payer: Self-pay | Admitting: Adult Health

## 2021-06-20 DIAGNOSIS — I129 Hypertensive chronic kidney disease with stage 1 through stage 4 chronic kidney disease, or unspecified chronic kidney disease: Secondary | ICD-10-CM | POA: Diagnosis not present

## 2021-06-20 DIAGNOSIS — G912 (Idiopathic) normal pressure hydrocephalus: Secondary | ICD-10-CM

## 2021-06-20 DIAGNOSIS — E43 Unspecified severe protein-calorie malnutrition: Secondary | ICD-10-CM | POA: Diagnosis not present

## 2021-06-20 DIAGNOSIS — N183 Chronic kidney disease, stage 3 unspecified: Secondary | ICD-10-CM

## 2021-06-20 DIAGNOSIS — F339 Major depressive disorder, recurrent, unspecified: Secondary | ICD-10-CM

## 2021-06-20 NOTE — Progress Notes (Signed)
Location:  [facility] Room Number: 155-P Place of Service:  SNF (31)   CODE STATUS: DNR  Allergies  Allergen Reactions   Acetaminophen Other (See Comments)    Increases LFT's   Penicillins Hives and Itching   Sulfa Antibiotics     Reaction unreported   Gabapentin Other (See Comments)    confusion   Niacin And Related Other (See Comments)    Body felt like it was burning   Pitavastatin Diarrhea and Rash    Chief Complaint  Patient presents with   Acute Visit    Care plan meeting    HPI:  We have come together for her care plan meeting. Family present  BIMS 6/15 mood 8/30: decreased energy; poor sleep; troubling concentrating. She is followed by palliative care. She is dependent care with her adls. She requires assistance with meals. She is nonambulatory. There have been no falls. She is incontinent of bowel. She has a foley failed voiding trial. Dietary:  mechanical soft diet   weight is 115 pounds which is stable. She is declining about half the time her prostat. She has a right ankle right heel wound. Therapy dependent for everything: feeding transfers bed mobility bathing; dressing toileting; ST; cognition 7/50: moderate dementia looking at splinting..  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 CA-125 mildly elevated at 53.1. Following with hematology/oncology.     Past Surgical History:  Procedure Laterality Date   ABDOMINAL HYSTERECTOMY     BACK SURGERY     x3   CATARACT EXTRACTION      Social History   Socioeconomic History   Marital status: Married    Spouse  name: Not on file   Number of children: Not on file   Years of education: Not on file   Highest education level: Not on file  Occupational History   Not on file  Tobacco Use   Smoking status: Never   Smokeless tobacco: Never  Vaping Use   Vaping Use: Never used  Substance and Sexual Activity   Alcohol use: Not Currently   Drug use: Never   Sexual activity: Not on file  Other Topics Concern   Not on file  Social History Narrative   Not on file   Social Determinants of Health   Financial Resource Strain: Not on file  Food Insecurity: Not on file  Transportation Needs: Not on file  Physical Activity: Not on file  Stress: Not on file  Social Connections: Not on file  Intimate Partner Violence: Not on file   Family History  Problem Relation Age of Onset   Colon cancer Neg Hx       VITAL SIGNS BP (!) 141/56   Pulse 82   Temp 98.4 F (36.9 C)   Resp 16   Ht 5' (1.524 m)   Wt 115 lb (52.2 kg)   SpO2 97%   BMI 22.46 kg/m   Outpatient Encounter Medications as of 06/20/2021  Medication Sig   Amino Acids-Protein Hydrolys (FEEDING SUPPLEMENT, PRO-STAT SUGAR FREE 64,) LIQD Take 30 mLs by mouth  3 (three) times daily with meals. 8 am, 12 pm, 6 pm   Balsam [place]-Castor Oil (VENELEX) OINT Apply 1 application topically as directed. Apply to bilateral buttocks, sacrum & coccyx qshift for prevention   diltiazem (CARDIZEM CD) 240 MG 24 hr capsule Take 240 mg by mouth daily. At 10 am   levothyroxine (SYNTHROID) 50 MCG tablet Take 50 mcg by mouth daily before breakfast.   lidocaine (LMX) 4 % cream Apply 1 application topically 3 (three) times daily as needed (apply to painful oints of knees and arms).   ondansetron (ZOFRAN-ODT) 4 MG disintegrating tablet Take 4 mg by mouth every 4 (four) hours as needed for nausea.   polyethylene glycol (MIRALAX / GLYCOLAX) 17 g packet Take 17 g by mouth daily. Mix with 4 oz of liquid or drink   POTASSIUM CHLORIDE PO Take by mouth. tablet extended  release; 20 mEq, Once A Day hypokalemia. Do not crush. Give with food and full glass of liquid.   sennosides-docusate sodium (SENOKOT-S) 8.6-50 MG tablet Take 2 tablets by mouth daily. 10 am   traMADol (ULTRAM) 50 MG tablet Take 0.5 tablets (25 mg total) by mouth daily as needed for up to 14 days.   UNABLE TO FIND Diet: Mechanical Soft   venlafaxine XR (EFFEXOR-XR) 37.5 MG 24 hr capsule Take 37.5 mg by mouth daily with breakfast. 10 am, DO NOT CRUSH   No facility-administered encounter medications on file as of 06/20/2021.     SIGNIFICANT DIAGNOSTIC EXAMS  LABS REVIEWED PREVIOUS   11-23-20: hgb a1c 5.4 05-25-21: iron 23; tibc 195 05-26-21: uric acid 6.3 06-04-21: tsh 5.29 06-05-21; wbc 7.7; hgb 7.9; hct 25.2; mcv 102; plt 442; glucose 85; bun 19; creat 1.00 ;k+ 3.8; na++ 139; ca 9.7; GFR 57; liver normal albumin 2.3  9-9-2: glucose 91; bun 18; creat 0.78; k+ 2.9; na++ 135; ca 8.6; GFR >60  NO NEW LABS.   Review of Systems  Constitutional:  Negative for malaise/fatigue.  Respiratory:  Negative for cough and shortness of breath.   Cardiovascular:  Negative for chest pain, palpitations and leg swelling.  Gastrointestinal:  Negative for abdominal pain, constipation and heartburn.  Musculoskeletal:  Negative for back pain, joint pain and myalgias.  Skin: Negative.   Neurological:  Negative for dizziness.  Psychiatric/Behavioral:  The patient is not nervous/anxious.    Physical Exam Constitutional:      General: She is not in acute distress.    Appearance: She is well-developed. She is not diaphoretic.     Comments: thin  Neck:     Thyroid: No thyromegaly.  Cardiovascular:     Rate and Rhythm: Normal rate and regular rhythm.     Pulses: Normal pulses.     Heart sounds: Normal heart sounds.  Pulmonary:     Effort: Pulmonary effort is normal. No respiratory distress.     Breath sounds: Normal breath sounds.  Abdominal:     General: Bowel sounds are normal. There is no distension.      Palpations: Abdomen is soft.     Tenderness: There is no abdominal tenderness.  Musculoskeletal:     Cervical back: Neck supple.     Right lower leg: No edema.     Left lower leg: No edema.     Comments:  Is able to move all extremities     Lymphadenopathy:     Cervical: No cervical adenopathy.  Skin:    General: Skin is warm and dry.     Comments: Right  lateral ankle: 2.6 x 3.0 cm Right heel: 5 x 6 cm    Neurological:     Mental Status: She is alert. Mental status is at baseline.  Psychiatric:        Mood and Affect: Mood normal.      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    Synthia Innocent NP [facility]  Contact [phone] Monday through Friday 8am- 5pm  After hours call [phone]

## 2021-07-01 ENCOUNTER — Non-Acute Institutional Stay (SKILLED_NURSING_FACILITY): Payer: Medicare Other | Admitting: Adult Health

## 2021-07-01 DIAGNOSIS — L89610 Pressure ulcer of right heel, unstageable: Secondary | ICD-10-CM

## 2021-07-01 DIAGNOSIS — L98429 Non-pressure chronic ulcer of back with unspecified severity: Secondary | ICD-10-CM | POA: Diagnosis not present

## 2021-07-01 NOTE — Progress Notes (Signed)
Location:  [facility] Room Number: 155 Place of Service:  SNF (31)   CODE STATUS: dnr  Allergies  Allergen Reactions   Acetaminophen Other (See Comments)    Increases LFT's   Penicillins Hives and Itching   Sulfa Antibiotics     Reaction unreported   Gabapentin Other (See Comments)    confusion   Niacin And Related Other (See Comments)    Body felt like it was burning   Pitavastatin Diarrhea and Rash    Chief Complaint  Patient presents with   Acute Visit    Wound management     HPI:  She has several ulcerations on her right foot/heel/ankle and sacrum. There are no reports of uncontrolled pain. There are no indications of infection present. No reports of fever present.   Past Medical History:  Diagnosis Date   Acquired hypothyroidism    Anxiety    High cholesterol    Hypertension    IBS (irritable bowel syndrome)    Insomnia    Pelvic mass    simple cyst. CA-125 mildly elevated at 53.1. Following with hematology/oncology.     Past Surgical History:  Procedure Laterality Date   ABDOMINAL HYSTERECTOMY     BACK SURGERY     x3   CATARACT EXTRACTION      Social History   Socioeconomic History   Marital status: Married    Spouse name: Not on file   Number of children: Not on file   Years of education: Not on file   Highest education level: Not on file  Occupational History   Not on file  Tobacco Use   Smoking status: Never   Smokeless tobacco: Never  Vaping Use   Vaping Use: Never used  Substance and Sexual Activity   Alcohol use: Not Currently   Drug use: Never   Sexual activity: Not on file  Other Topics Concern   Not on file  Social History Narrative   Not on file   Social Determinants of Health   Financial Resource Strain: Not on file  Food Insecurity: Not on file  Transportation Needs: Not on file  Physical Activity: Not on file  Stress: Not on file  Social Connections: Not on file  Intimate Partner Violence:  Not on file   Family History  Problem Relation Age of Onset   Colon cancer Neg Hx       VITAL SIGNS BP 134/80   Pulse 76   Temp 98.4 F (36.9 C)   Ht 5' (1.524 m)   Wt 115 lb (52.2 kg)   BMI 22.46 kg/m   Outpatient Encounter Medications as of 07/01/2021  Medication Sig   Amino Acids-Protein Hydrolys (FEEDING SUPPLEMENT, PRO-STAT SUGAR FREE 64,) LIQD Take 30 mLs by mouth 3 (three) times daily with meals. 8 am, 12 pm, 6 pm   Balsam [place]-Castor Oil (VENELEX) OINT Apply 1 application topically as directed. Apply to bilateral buttocks, sacrum & coccyx qshift for prevention   diltiazem (CARDIZEM CD) 240 MG 24 hr capsule Take 240 mg by mouth daily. At 10 am   levothyroxine (SYNTHROID) 50 MCG tablet Take 50 mcg by mouth daily before breakfast.   lidocaine (LMX) 4 % cream Apply 1 application topically 3 (three) times daily as needed (apply to painful oints of knees and arms).   ondansetron (ZOFRAN-ODT) 4 MG disintegrating tablet Take 4 mg by mouth every 4 (four) hours as needed for nausea.   polyethylene glycol (MIRALAX / GLYCOLAX) 17  g packet Take 17 g by mouth daily. Mix with 4 oz of liquid or drink   POTASSIUM CHLORIDE PO Take by mouth. tablet extended release; 20 mEq, Once A Day hypokalemia. Do not crush. Give with food and full glass of liquid.   sennosides-docusate sodium (SENOKOT-S) 8.6-50 MG tablet Take 2 tablets by mouth daily. 10 am   UNABLE TO FIND Diet: Mechanical Soft   venlafaxine XR (EFFEXOR-XR) 37.5 MG 24 hr capsule Take 37.5 mg by mouth daily with breakfast. 10 am, DO NOT CRUSH   No facility-administered encounter medications on file as of 07/01/2021.     SIGNIFICANT DIAGNOSTIC EXAMS   LABS REVIEWED PREVIOUS   11-23-20: hgb a1c 5.4 05-25-21: iron 23; tibc 195 05-26-21: uric acid 6.3 06-04-21: tsh 5.29 06-05-21; wbc 7.7; hgb 7.9; hct 25.2; mcv 102; plt 442; glucose 85; bun 19; creat 1.00 ;k+ 3.8; na++ 139; ca 9.7; GFR 57; liver normal albumin 2.3  9-9-2: glucose 91;  bun 18; creat 0.78; k+ 2.9; na++ 135; ca 8.6; GFR >60  NO NEW LABS.   Review of Systems  Constitutional:  Negative for malaise/fatigue.  Respiratory:  Negative for cough and shortness of breath.   Cardiovascular:  Negative for chest pain, palpitations and leg swelling.  Gastrointestinal:  Negative for abdominal pain, constipation and heartburn.  Musculoskeletal:  Negative for back pain, joint pain and myalgias.  Skin:        Sores   Neurological:  Negative for dizziness.  Psychiatric/Behavioral:  The patient is not nervous/anxious.    Physical Exam Constitutional:      General: She is not in acute distress.    Appearance: She is well-developed. She is not diaphoretic.  Neck:     Thyroid: No thyromegaly.  Cardiovascular:     Rate and Rhythm: Normal rate and regular rhythm.     Pulses: Normal pulses.     Heart sounds: Normal heart sounds.  Pulmonary:     Effort: Pulmonary effort is normal. No respiratory distress.     Breath sounds: Normal breath sounds.  Abdominal:     General: Bowel sounds are normal. There is no distension.     Palpations: Abdomen is soft.     Tenderness: There is no abdominal tenderness.  Musculoskeletal:     Cervical back: Neck supple.     Right lower leg: No edema.     Left lower leg: No edema.     Comments:  Is able to move all extremities      Lymphadenopathy:     Cervical: No cervical adenopathy.  Skin:    General: Skin is warm and dry.     Comments: Right lateral ankle: 0.8 x 0.8 cm scab Right anterior foot 0.8 x 0.8  cm scab Right heel unstagable: 5 x 5 cm: soft eschar present Unstagable sacral ulcer: slough present in wound bed   Neurological:     Mental Status: She is alert. Mental status is at baseline.  Psychiatric:        Mood and Affect: Mood normal.     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  Will get air mattress   Synthia Innocent NP [facility]  Contact [phone] Monday through Friday 8am- 5pm  After hours call [phone]

## 2021-07-05 ENCOUNTER — Non-Acute Institutional Stay (SKILLED_NURSING_FACILITY): Payer: Medicare Other | Admitting: Adult Health

## 2021-07-05 ENCOUNTER — Encounter: Payer: Self-pay | Admitting: Adult Health

## 2021-07-05 DIAGNOSIS — G912 (Idiopathic) normal pressure hydrocephalus: Secondary | ICD-10-CM | POA: Diagnosis not present

## 2021-07-05 DIAGNOSIS — E43 Unspecified severe protein-calorie malnutrition: Secondary | ICD-10-CM | POA: Diagnosis not present

## 2021-07-05 DIAGNOSIS — R627 Adult failure to thrive: Secondary | ICD-10-CM | POA: Diagnosis not present

## 2021-07-05 MED ORDER — OXYCODONE HCL 5 MG PO TABS: 2.5000 mg | ORAL_TABLET | Freq: Three times a day (TID) | ORAL | 0 refills | Status: DC

## 2021-07-05 NOTE — Progress Notes (Signed)
Location:  [facility] Room Number: 155 Place of Service:  SNF (31)   CODE STATUS: dnr   Allergies  Allergen Reactions   Acetaminophen Other (See Comments)    Increases LFT's   Penicillins Hives and Itching   Sulfa Antibiotics     Reaction unreported   Gabapentin Other (See Comments)    confusion   Niacin And Related Other (See Comments)    Body felt like it was burning   Pitavastatin Diarrhea and Rash    Chief Complaint  Patient presents with   Acute Visit    Care plan meeting for goals of care     HPI:  We have come together for her care plan meeting. Family present . BIMS 6/15 mood 8/30: decreased energy; not sleeping; trouble concentrating. She is dependent for all her adls. She has a foley and is incontinent of bowel. She is nonambulatory. She has unstaged ulcer on right heel; right lateral ankle is improving; dark area right dorsal foot. She does have increased pain in her right leg. There have been no falls. Therapy discharged from therapy at this time; splinting to hands.  Dietary: weight loss: 29 pounds since July; very poor appetite requires assistance with meals . She does have right foot and leg pain with any type of movement. She is not able to take gabapentin. Will need to start her on oxycodone. She had been taking this medication prior to her hospitalization; at home.  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 CA-125 mildly elevated at 53.1. Following with hematology/oncology.     Past Surgical History:  Procedure Laterality Date   ABDOMINAL HYSTERECTOMY     BACK SURGERY     x3   CATARACT EXTRACTION       Social History   Socioeconomic History   Marital status: Married    Spouse name: Not on file   Number of children: Not on file   Years of education: Not on file   Highest education level: Not on file  Occupational History   Not on file  Tobacco Use   Smoking status: Never   Smokeless tobacco: Never  Vaping Use   Vaping Use: Never used  Substance and Sexual Activity   Alcohol use: Not Currently   Drug use: Never   Sexual activity: Not on file  Other Topics Concern   Not on file  Social History Narrative   Not on file   Social Determinants of Health   Financial Resource Strain: Not on file  Food Insecurity: Not on file  Transportation Needs: Not on file  Physical Activity: Not on file  Stress: Not on file  Social Connections: Not on file  Intimate Partner Violence: Not on file   Family History  Problem Relation Age of Onset   Colon cancer Neg Hx       VITAL SIGNS BP 134/80   Pulse 76   Temp 98.4 F (36.9 C)   Resp 16   Ht 5' (1.524 m)   Wt 115 lb (52.2 kg)   SpO2 97%   BMI 22.46 kg/m  Outpatient Encounter Medications as of 07/05/2021  Medication Sig   Amino Acids-Protein Hydrolys (FEEDING SUPPLEMENT, PRO-STAT SUGAR FREE 64,) LIQD Take 30 mLs by mouth 3 (three) times daily with meals. 8 am, 12 pm, 6 pm   Balsam [place]-Castor Oil (VENELEX) OINT Apply 1 application topically as directed. Apply to bilateral buttocks, sacrum & coccyx qshift for prevention   diltiazem (CARDIZEM CD) 240 MG 24 hr capsule Take 240 mg by mouth daily. At 10 am   levothyroxine (SYNTHROID) 50 MCG tablet Take 50 mcg by mouth daily before breakfast.   lidocaine (LMX) 4 % cream Apply 1 application topically 3 (three) times daily as needed (apply to painful oints of knees and arms).   ondansetron (ZOFRAN-ODT) 4 MG disintegrating tablet Take 4 mg by mouth every 4 (four) hours as needed for nausea.   polyethylene glycol (MIRALAX / GLYCOLAX) 17 g packet Take 17 g by mouth daily. Mix with 4  oz of liquid or drink   POTASSIUM CHLORIDE PO Take by mouth. tablet extended release; 20 mEq, Once A Day hypokalemia. Do not crush. Give with food and full glass of liquid.   sennosides-docusate sodium (SENOKOT-S) 8.6-50 MG tablet Take 2 tablets by mouth daily. 10 am   UNABLE TO FIND Diet: Mechanical Soft   venlafaxine XR (EFFEXOR-XR) 37.5 MG 24 hr capsule Take 37.5 mg by mouth daily with breakfast. 10 am, DO NOT CRUSH   No facility-administered encounter medications on file as of 07/05/2021.     SIGNIFICANT DIAGNOSTIC EXAMS  LABS REVIEWED PREVIOUS   11-23-20: hgb a1c 5.4 05-25-21: iron 23; tibc 195 05-26-21: uric acid 6.3 06-04-21: tsh 5.29 06-05-21; wbc 7.7; hgb 7.9; hct 25.2; mcv 102; plt 442; glucose 85; bun 19; creat 1.00 ;k+ 3.8; na++ 139; ca 9.7; GFR 57; liver normal albumin 2.3  9-9-2: glucose 91; bun 18; creat 0.78; k+ 2.9; na++ 135; ca 8.6; GFR >60  TODAY  06-17-21: k+ 4.0   Review of Systems  Constitutional:  Negative for malaise/fatigue.  Respiratory:  Negative for cough.   Cardiovascular:  Negative for chest pain.  Gastrointestinal:  Negative for abdominal pain.  Musculoskeletal:  Negative for back pain, joint pain and myalgias.  Skin: Negative.   Neurological:  Positive for weakness.  Psychiatric/Behavioral:  The patient is not nervous/anxious.      Physical Exam Constitutional:      General: She is not in acute distress.    Appearance: She is well-developed. She is not diaphoretic.  Neck:     Thyroid: No thyromegaly.  Cardiovascular:     Rate and Rhythm: Normal rate and regular rhythm.     Pulses: Normal pulses.     Heart sounds: Normal heart sounds.  Pulmonary:     Effort: Pulmonary effort is normal. No respiratory distress.     Breath sounds: Normal breath sounds.  Abdominal:     General: Bowel sounds are normal. There is no distension.     Palpations: Abdomen is soft.     Tenderness: There is no abdominal tenderness.  Genitourinary:    Comments:  foley Musculoskeletal:     Cervical back: Neck supple.     Right lower leg: No edema.     Left lower leg: No edema.     Comments:  Is able to move all extremities       Lymphadenopathy:     Cervical: No cervical adenopathy.  Skin:    General: Skin is warm and dry.     Comments: Right lateral ankle:  0.8 x 0.8 cm scab Right anterior foot 0.8 x 0.8  cm scab Right heel unstagable: 5 x 5 cm: soft eschar present Unstagable sacral ulcer: slough present in wound bed    Neurological:     Mental Status: She is alert. Mental status is at baseline.  Psychiatric:        Mood and Affect: Mood normal.      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 With comfort care: is due to palliative consult next week. No tube feeding.  No further lab work; no further hospitalizations.  Will begin oxycodone 2.5 mg every 8 hours and will make further adjustments as indicated.   Will continue to monitor her status.   Time spent with patient 40 minutes: plan of care goals of care and future needs.    Synthia Innocent NP [facility]  Contact [phone] Monday through Friday 8am- 5pm  After hours call [phone]

## 2021-07-06 DEATH — deceased

## 2021-07-12 ENCOUNTER — Non-Acute Institutional Stay (SKILLED_NURSING_FACILITY): Payer: Medicare Other | Admitting: Adult Health

## 2021-07-12 ENCOUNTER — Encounter: Payer: Self-pay | Admitting: Adult Health

## 2021-07-12 DIAGNOSIS — F339 Major depressive disorder, recurrent, unspecified: Secondary | ICD-10-CM

## 2021-07-12 DIAGNOSIS — N1831 Chronic kidney disease, stage 3a: Secondary | ICD-10-CM | POA: Diagnosis not present

## 2021-07-12 DIAGNOSIS — D631 Anemia in chronic kidney disease: Secondary | ICD-10-CM

## 2021-07-12 DIAGNOSIS — G912 (Idiopathic) normal pressure hydrocephalus: Secondary | ICD-10-CM | POA: Diagnosis not present

## 2021-07-12 NOTE — Progress Notes (Signed)
Location:  [facility] Room Number: 155-P Place of Service:  SNF (31)   CODE STATUS: DNR  Allergies  Allergen Reactions   Acetaminophen Other (See Comments)    Increases LFT's   Penicillins Hives and Itching   Sulfa Antibiotics     Reaction unreported   Gabapentin Other (See Comments)    confusion   Niacin And Related Other (See Comments)    Body felt like it was burning   Pitavastatin Diarrhea and Rash    Chief Complaint  Patient presents with   Medical Management of Chronic Issues               NPH (normal pressure hydrocephalus)  Anemia due to stage 3a chronic kidney disease: Marland Kitchen Major depression recurrent chronic:    HPI:  She is a 80 year old long term resident of this facility being seen for the management of her chronic illnesses: NPH (normal pressure hydrocephalus)  Anemia due to stage 3a chronic kidney disease: Marland Kitchen Major depression recurrent chronic:. There are no reports of uncontrolled pain. She continues to slowly decline overall. She continues with wound care. She does spend all of her time in bed.   Past Medical History:  Diagnosis Date   Acquired hypothyroidism    Anxiety    High cholesterol    Hypertension    IBS (irritable bowel syndrome)    Insomnia    Pelvic mass    simple cyst. CA-125 mildly elevated at 53.1. Following with hematology/oncology.     Past Surgical History:  Procedure Laterality Date   ABDOMINAL HYSTERECTOMY     BACK SURGERY     x3   CATARACT EXTRACTION      Social History   Socioeconomic History   Marital status: Married    Spouse name: Not on file   Number of children: Not on file   Years of education: Not on file   Highest education level: Not on file  Occupational History   Not on file  Tobacco Use   Smoking status: Never   Smokeless tobacco: Never  Vaping Use   Vaping Use: Never used  Substance and Sexual Activity   Alcohol use: Not Currently   Drug use: Never   Sexual activity: Not on file   Other Topics Concern   Not on file  Social History Narrative   Not on file   Social Determinants of Health   Financial Resource Strain: Not on file  Food Insecurity: Not on file  Transportation Needs: Not on file  Physical Activity: Not on file  Stress: Not on file  Social Connections: Not on file  Intimate Partner Violence: Not on file   Family History  Problem Relation Age of Onset   Colon cancer Neg Hx       VITAL SIGNS BP (!) 152/82   Pulse 84   Temp 98.4 F (36.9 C)   Resp 16   Ht 5' (1.524 m)   Wt 110 lb 6.4 oz (50.1 kg)   SpO2 97%   BMI 21.56 kg/m   Outpatient Encounter Medications as of 07/12/2021  Medication Sig   Amino Acids-Protein Hydrolys (FEEDING SUPPLEMENT, PRO-STAT SUGAR FREE 64,) LIQD Take 30 mLs by mouth 3 (three) times daily with meals. 8 am, 12 pm, 6 pm   collagenase (SANTYL) ointment Apply 1 application topically daily. Apply to right heel wound and sacral wound per tx order   diltiazem (CARDIZEM CD) 240 MG 24 hr capsule Take 240 mg by  mouth daily. At 10 am   levothyroxine (SYNTHROID) 50 MCG tablet Take 50 mcg by mouth daily before breakfast.   lidocaine (LMX) 4 % cream Apply 1 application topically 3 (three) times daily as needed (apply to painful oints of knees and arms).   ondansetron (ZOFRAN-ODT) 4 MG disintegrating tablet Take 4 mg by mouth every 4 (four) hours as needed for nausea.   oxyCODONE (ROXICODONE) 5 MG immediate release tablet Take 0.5 tablets (2.5 mg total) by mouth every 8 (eight) hours.   polyethylene glycol (MIRALAX / GLYCOLAX) 17 g packet Take 17 g by mouth daily. Mix with 4 oz of liquid or drink   POTASSIUM CHLORIDE PO Take by mouth. tablet extended release; 20 mEq, Once A Day hypokalemia. Do not crush. Give with food and full glass of liquid.   sennosides-docusate sodium (SENOKOT-S) 8.6-50 MG tablet Take 2 tablets by mouth daily. 10 am   traMADol (ULTRAM) 50 MG tablet Take 25 mg by mouth as needed. give 30 minutes prior to  therapy   UNABLE TO FIND Diet: Mechanical Soft   venlafaxine XR (EFFEXOR-XR) 37.5 MG 24 hr capsule Take 37.5 mg by mouth daily with breakfast. 10 am, DO NOT CRUSH   [DISCONTINUED] Balsam [place]-Castor Oil (VENELEX) OINT Apply 1 application topically as directed. Apply to bilateral buttocks, sacrum & coccyx qshift for prevention   No facility-administered encounter medications on file as of 07/12/2021.     SIGNIFICANT DIAGNOSTIC EXAMS   LABS REVIEWED PREVIOUS   11-23-20: hgb a1c 5.4 05-25-21: iron 23; tibc 195 05-26-21: uric acid 6.3 06-04-21: tsh 5.29 06-05-21; wbc 7.7; hgb 7.9; hct 25.2; mcv 102; plt 442; glucose 85; bun 19; creat 1.00 ;k+ 3.8; na++ 139; ca 9.7; GFR 57; liver normal albumin 2.3  9-9-2: glucose 91; bun 18; creat 0.78; k+ 2.9; na++ 135; ca 8.6; GFR >60 06-17-21: k+ 4.0  NO NEW LABS    Review of Systems  Constitutional:  Negative for malaise/fatigue.  Respiratory:  Negative for cough.   Cardiovascular:  Negative for chest pain.  Gastrointestinal:  Negative for abdominal pain.  Musculoskeletal:  Negative for myalgias.  Skin:        Sore   Neurological:  Negative for dizziness.  Psychiatric/Behavioral:  The patient is not nervous/anxious.    Physical Exam Constitutional:      General: She is not in acute distress.    Appearance: She is underweight. She is not diaphoretic.  Neck:     Thyroid: No thyromegaly.  Cardiovascular:     Rate and Rhythm: Normal rate and regular rhythm.     Pulses: Normal pulses.     Heart sounds: Normal heart sounds.  Pulmonary:     Effort: Pulmonary effort is normal. No respiratory distress.     Breath sounds: Normal breath sounds.  Abdominal:     General: Bowel sounds are normal. There is no distension.     Palpations: Abdomen is soft.     Tenderness: There is no abdominal tenderness.  Genitourinary:    Comments: Foley  Musculoskeletal:     Cervical back: Neck supple.     Right lower leg: No edema.     Left lower leg: No edema.      Comments: Is able to move all extremities       Lymphadenopathy:     Cervical: No cervical adenopathy.  Skin:    General: Skin is warm and dry.     Comments: Right lateral ankle: 0.8 x 0.8 cm scab Right anterior  foot 0.8 x 0.8  cm scab Right heel unstagable: 5 x 5 cm: soft eschar present Unstagable sacral ulcer: slough present in wound bed  Neurological:     Mental Status: She is alert. Mental status is at baseline.  Psychiatric:        Mood and Affect: Mood normal.     ASSESSMENT/ PLAN:  TODAY  NPH (normal pressure hydrocephalus) she had declined VP shunt; no further neurological follow up indicated  2. Anemia due to stage 3a chronic kidney disease: is without change hgb 7.9; will monitor   3. Major depression recurrent chronic: is stable will continue effexor xr 37.5 mg daily   PREVIOUS   4. Primary osteoarthritis involving multiple sites: is stable will continue lidocaine 4% cream to knees and arms three times daily as needed; oxycodone 5 mg every 6 hours as needed for severe pain.   5. Protein calorie malnutrition severe: is without change albumin 2.3 will continue  prostat 30 mL with meals.   6. Irritable bowel syndrome with constipation: is stable will continue miralax daily and senna s 2 tabs daily   7.  Acquired hypothyroidism: is stable tsh 5.29 will continue synthroid 50 mcg daily   8. Stage 3a chronic kidney disease: is stable: bun 19; creat 1.00; GFR 57  9. Benign hypertension with chronic kidney disease stage III: is stable b/p 152/82 will continue tiaz cd 380 mg daily    Synthia Innocent NP [facility]  Contact [phone] Monday through Friday 8am- 5pm  After hours call [phone]

## 2021-07-18 ENCOUNTER — Other Ambulatory Visit: Payer: Self-pay | Admitting: Adult Health

## 2021-07-18 MED ORDER — OXYCODONE HCL 5 MG PO TABS: 5.0000 mg | ORAL_TABLET | Freq: Three times a day (TID) | ORAL | 0 refills | Status: AC

## 2021-08-02 ENCOUNTER — Non-Acute Institutional Stay (SKILLED_NURSING_FACILITY): Payer: Medicare Other | Admitting: Adult Health

## 2021-08-02 ENCOUNTER — Encounter: Payer: Self-pay | Admitting: Adult Health

## 2021-08-02 DIAGNOSIS — G912 (Idiopathic) normal pressure hydrocephalus: Secondary | ICD-10-CM | POA: Diagnosis not present

## 2021-08-02 DIAGNOSIS — F339 Major depressive disorder, recurrent, unspecified: Secondary | ICD-10-CM | POA: Diagnosis not present

## 2021-08-02 DIAGNOSIS — L98429 Non-pressure chronic ulcer of back with unspecified severity: Secondary | ICD-10-CM

## 2021-08-02 DIAGNOSIS — E43 Unspecified severe protein-calorie malnutrition: Secondary | ICD-10-CM | POA: Diagnosis not present

## 2021-08-02 NOTE — Progress Notes (Signed)
Location:  [facility] Room Number: 138 Place of Service:  SNF (31) Provider: Synthia Innocent, NP  CODE STATUS: DNR  Allergies  Allergen Reactions   Acetaminophen Other (See Comments)    Increases LFT's   Penicillins Hives and Itching   Sulfa Antibiotics     Reaction unreported   Gabapentin Other (See Comments)    confusion   Niacin And Related Other (See Comments)    Body felt like it was burning   Pitavastatin Diarrhea and Rash    Chief Complaint  Patient presents with   Acute Visit    Care plan meeting    HPI:  We have come together for her care plan meeting. BIMS 13/15 (last 6); mood 5/30 (last 8).: feels down at times; decreased appetite. She is dependent for her adls. She is nonambulatory; she is incontinent of bowel; has foley. There have been no falls. Dietary: weight is 110.5 pounds  down 5 pounds in the past week. She is dependent for her meals. Is on mech soft diet; with poor to fair appetite. Therapy: has feinger separating pillow with ROM right hand; resting hand splint on left hand. Has unstaged right heel ulceration. Right ankle DTI; unstaged sacral ulceration. Right dorsal foot venous wound. 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 CA-125 mildly elevated at 53.1. Following with hematology/oncology.     Past Surgical History:  Procedure Laterality Date   ABDOMINAL HYSTERECTOMY     BACK SURGERY     x3   CATARACT EXTRACTION      Social History   Socioeconomic History   Marital status: Married    Spouse name: Not on file   Number of children: Not on file   Years of education: Not on file    Highest education level: Not on file  Occupational History   Not on file  Tobacco Use   Smoking status: Never   Smokeless tobacco: Never  Vaping Use   Vaping Use: Never used  Substance and Sexual Activity   Alcohol use: Not Currently   Drug use: Never   Sexual activity: Not on file  Other Topics Concern   Not on file  Social History Narrative   Not on file   Social Determinants of Health   Financial Resource Strain: Not on file  Food Insecurity: Not on file  Transportation Needs: Not on file  Physical Activity: Not on file  Stress: Not on file  Social Connections: Not on file  Intimate Partner Violence: Not on file   Family History  Problem Relation Age of Onset   Colon cancer Neg Hx       VITAL SIGNS BP (!) 148/74   Pulse 87   Temp 98.4 F (36.9 C)   Resp 16   Ht 5' (1.524 m)   Wt 110 lb 6.4 oz (50.1 kg)   SpO2 97%   BMI 21.56 kg/m   Outpatient Encounter Medications as of 08/02/2021  Medication Sig   collagenase (SANTYL) ointment Apply 1 application topically daily. Apply to right heel wound and sacral wound per tx order   diltiazem (CARDIZEM  CD) 240 MG 24 hr capsule Take 240 mg by mouth daily. At 10 am   levothyroxine (SYNTHROID) 50 MCG tablet Take 50 mcg by mouth daily before breakfast.   lidocaine (LMX) 4 % cream Apply 1 application topically 3 (three) times daily as needed (apply to painful oints of knees and arms).   ondansetron (ZOFRAN-ODT) 4 MG disintegrating tablet Take 4 mg by mouth every 4 (four) hours as needed for nausea.   oxyCODONE (ROXICODONE) 5 MG immediate release tablet Take 1 tablet (5 mg total) by mouth every 8 (eight) hours.   polyethylene glycol (MIRALAX / GLYCOLAX) 17 g packet Take 17 g by mouth daily. Mix with 4 oz of liquid or drink   POTASSIUM CHLORIDE PO Take by mouth. tablet extended release; 20 mEq, Once A Day hypokalemia. Do not crush. Give with food and full glass of liquid.   sennosides-docusate sodium (SENOKOT-S) 8.6-50 MG  tablet Take 2 tablets by mouth daily. 10 am   UNABLE TO FIND Diet: Mechanical Soft   venlafaxine XR (EFFEXOR-XR) 37.5 MG 24 hr capsule Take 37.5 mg by mouth daily with breakfast. 10 am, DO NOT CRUSH   [DISCONTINUED] Amino Acids-Protein Hydrolys (FEEDING SUPPLEMENT, PRO-STAT SUGAR FREE 64,) LIQD Take 30 mLs by mouth 3 (three) times daily with meals. 8 am, 12 pm, 6 pm   No facility-administered encounter medications on file as of 08/02/2021.     SIGNIFICANT DIAGNOSTIC EXAMS  LABS REVIEWED PREVIOUS   11-23-20: hgb a1c 5.4 05-25-21: iron 23; tibc 195 05-26-21: uric acid 6.3 06-04-21: tsh 5.29 06-05-21; wbc 7.7; hgb 7.9; hct 25.2; mcv 102; plt 442; glucose 85; bun 19; creat 1.00 ;k+ 3.8; na++ 139; ca 9.7; GFR 57; liver normal albumin 2.3  9-9-2: glucose 91; bun 18; creat 0.78; k+ 2.9; na++ 135; ca 8.6; GFR >60 06-17-21: k+ 4.0  NO NEW LABS  Review of Systems  Constitutional:  Negative for malaise/fatigue.  Respiratory:  Negative for cough.   Cardiovascular:  Negative for chest pain.  Gastrointestinal:  Negative for abdominal pain.  Musculoskeletal:  Negative for myalgias.  Skin: Negative.   Psychiatric/Behavioral:  The patient is not nervous/anxious.    Physical Exam Constitutional:      General: She is not in acute distress.    Appearance: She is well-developed. She is not diaphoretic.  Neck:     Thyroid: No thyromegaly.  Cardiovascular:     Rate and Rhythm: Normal rate and regular rhythm.     Pulses: Normal pulses.     Heart sounds: Normal heart sounds.  Pulmonary:     Effort: Pulmonary effort is normal. No respiratory distress.     Breath sounds: Normal breath sounds.  Abdominal:     General: Bowel sounds are normal. There is no distension.     Palpations: Abdomen is soft.     Tenderness: There is no abdominal tenderness.  Genitourinary:    Comments: Foley  Musculoskeletal:     Cervical back: Neck supple.     Right lower leg: No edema.     Left lower leg: No edema.      Comments: Is able to move all extremities   Lymphadenopathy:     Cervical: No cervical adenopathy.  Skin:    General: Skin is warm and dry.     Comments: Right lateral ankle: 0.8 x 0.8 cm scab Right anterior foot 0.8 x 0.8  cm scab Right heel unstagable: 5 x 5 cm: soft eschar present Unstagable sacral ulcer: slough present in wound  bed   Neurological:     Mental Status: She is alert. Mental status is at baseline.  Psychiatric:        Mood and Affect: Mood normal.      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   Time spent patient: 40 minutes: mood state with medication adjustment; medications.    Synthia Innocent NP [facility]  Contact [phone] Monday through Friday 8am- 5pm  After hours call [phone]

## 2021-08-06 DEATH — deceased
